# Patient Record
Sex: Male | Born: 1950 | Race: White | Hispanic: No | Marital: Single | Smoking: Current some day smoker
Health system: Southern US, Community
[De-identification: ages and names within clinical notes are randomized; demographics above are authoritative.]

## PROBLEM LIST (undated history)

## (undated) DIAGNOSIS — M1990 Unspecified osteoarthritis, unspecified site: Secondary | ICD-10-CM

## (undated) DIAGNOSIS — J302 Other seasonal allergic rhinitis: Secondary | ICD-10-CM

## (undated) DIAGNOSIS — G43909 Migraine, unspecified, not intractable, without status migrainosus: Secondary | ICD-10-CM

## (undated) DIAGNOSIS — B019 Varicella without complication: Secondary | ICD-10-CM

## (undated) HISTORY — DX: Unspecified osteoarthritis, unspecified site: M19.90

## (undated) HISTORY — DX: Migraine, unspecified, not intractable, without status migrainosus: G43.909

## (undated) HISTORY — DX: Other seasonal allergic rhinitis: J30.2

## (undated) HISTORY — DX: Varicella without complication: B01.9

---

## 1999-01-14 ENCOUNTER — Emergency Department (HOSPITAL_COMMUNITY): Admission: EM | Admit: 1999-01-14 | Discharge: 1999-01-14 | Payer: Self-pay | Admitting: Emergency Medicine

## 1999-01-21 ENCOUNTER — Emergency Department (HOSPITAL_COMMUNITY): Admission: EM | Admit: 1999-01-21 | Discharge: 1999-01-21 | Payer: Self-pay | Admitting: Emergency Medicine

## 2005-07-30 ENCOUNTER — Emergency Department (HOSPITAL_COMMUNITY): Admission: EM | Admit: 2005-07-30 | Discharge: 2005-07-31 | Payer: Self-pay | Admitting: Emergency Medicine

## 2007-02-26 ENCOUNTER — Emergency Department (HOSPITAL_COMMUNITY): Admission: EM | Admit: 2007-02-26 | Discharge: 2007-02-26 | Payer: Self-pay | Admitting: Internal Medicine

## 2017-02-03 ENCOUNTER — Encounter: Payer: Self-pay | Admitting: Adult Health

## 2017-02-03 ENCOUNTER — Encounter: Payer: Self-pay | Admitting: Gastroenterology

## 2017-02-03 ENCOUNTER — Ambulatory Visit (INDEPENDENT_AMBULATORY_CARE_PROVIDER_SITE_OTHER): Payer: Medicare Other | Admitting: Adult Health

## 2017-02-03 VITALS — BP 112/66 | Temp 97.5°F | Ht 70.0 in | Wt 167.0 lb

## 2017-02-03 DIAGNOSIS — Z1211 Encounter for screening for malignant neoplasm of colon: Secondary | ICD-10-CM | POA: Diagnosis not present

## 2017-02-03 DIAGNOSIS — Z7689 Persons encountering health services in other specified circumstances: Secondary | ICD-10-CM | POA: Diagnosis not present

## 2017-02-03 DIAGNOSIS — B351 Tinea unguium: Secondary | ICD-10-CM

## 2017-02-03 DIAGNOSIS — Z23 Encounter for immunization: Secondary | ICD-10-CM

## 2017-02-03 MED ORDER — FLUTICASONE PROPIONATE 50 MCG/ACT NA SUSP: 2.0000 | Freq: Every day | NASAL | 6 refills | Status: DC

## 2017-02-03 MED ORDER — OMEPRAZOLE 20 MG PO CPDR
20.0000 mg | DELAYED_RELEASE_CAPSULE | Freq: Every day | ORAL | 3 refills | Status: DC
Start: 2017-02-03 — End: 2017-07-04

## 2017-02-03 NOTE — Patient Instructions (Signed)
It was great meeting you today   Someone will call you to schedule your colonoscopy and appointment with a foot doctor   Please follow up with me for your physical   I have sent in two prescriptions   1. Flonase- to help with the sinuses  2. Prilosec - to help with acid indigestion

## 2017-02-03 NOTE — Progress Notes (Signed)
Patient presents to clinic today to establish care. She is a pleasant 66 year old male who  has a past medical history of Arthritis; Migraines; and Seasonal allergies.  Has not been seen for greater than 10 years    Acute Concerns: Establish Care   Chronic Issues: Seasonal Allergies - Does not take anything on a regular basis  GERD -  Does not take any medication on a regular basis. He has been having worsening symptoms of heart burn, especially apparent after eating   Health Maintenance: Dental -- Does not do routine visits Vision -- Doe snot do routine visits  Immunizations -- UTD  Colonoscopy -- Never had  Diet: Does not follow a specific diet  Exercise: Does not exercise on a regular basis    Past Medical History:  Diagnosis Date  . Arthritis   . Migraines   . Seasonal allergies     No past surgical history on file.  No current outpatient prescriptions on file prior to visit.   No current facility-administered medications on file prior to visit.     No Known Allergies  Family History  Problem Relation Age of Onset  . Arthritis Mother   . Hearing loss Mother   . Stroke Mother   . Diabetes Father   . Hearing loss Father   . Heart attack Sister   . Hyperlipidemia Sister   . Hypertension Sister     Social History   Social History  . Marital status: Single    Spouse name: N/A  . Number of children: N/A  . Years of education: N/A   Occupational History  . Not on file.   Social History Main Topics  . Smoking status: Never Smoker  . Smokeless tobacco: Never Used  . Alcohol use 2.4 oz/week    4 Cans of beer per week  . Drug use: No  . Sexual activity: Not on file   Other Topics Concern  . Not on file   Social History Narrative  . No narrative on file    Review of Systems  Constitutional: Negative.   HENT: Positive for congestion.   Eyes: Negative.   Respiratory: Negative.   Cardiovascular: Negative.   Gastrointestinal: Positive for  heartburn. Negative for abdominal pain, diarrhea, nausea and vomiting.  Genitourinary: Negative.   Musculoskeletal: Negative.   Skin: Negative.   Neurological: Negative.   Endo/Heme/Allergies: Negative.   Psychiatric/Behavioral: Negative.   All other systems reviewed and are negative.   BP 112/66 (BP Location: Left Arm)   Temp (!) 97.5 F (36.4 C) (Oral)   Ht 5\' 10"  (1.778 m)   Wt 167 lb (75.8 kg)   BMI 23.96 kg/m   Physical Exam  Constitutional: He is oriented to person, place, and time and well-developed, well-nourished, and in no distress. No distress.  HENT:  Head: Normocephalic and atraumatic.  Right Ear: Hearing, tympanic membrane, external ear and ear canal normal.  Left Ear: Hearing, tympanic membrane, external ear and ear canal normal.  Nose: Nose normal. No mucosal edema or rhinorrhea. Right sinus exhibits no maxillary sinus tenderness and no frontal sinus tenderness. Left sinus exhibits no maxillary sinus tenderness and no frontal sinus tenderness.  Mouth/Throat: Oropharynx is clear and moist and mucous membranes are normal. Abnormal dentition. Dental caries present.  Neck: Trachea normal and normal range of motion. Neck supple. Carotid bruit is not present. No thyroid mass and no thyromegaly present.  Cardiovascular: Normal rate, regular rhythm, normal heart sounds and intact distal  pulses.  Exam reveals no gallop and no friction rub.   No murmur heard. Pulmonary/Chest: Effort normal and breath sounds normal. No respiratory distress. He has no wheezes. He has no rales. He exhibits no tenderness.  Abdominal: Soft. Bowel sounds are normal. He exhibits no distension and no mass. There is no tenderness. There is no rebound and no guarding.  Lymphadenopathy:    He has no cervical adenopathy.  Neurological: He is alert and oriented to person, place, and time. He displays normal reflexes. No cranial nerve deficit. He exhibits normal muscle tone. Gait normal. Coordination normal.  GCS score is 15.  Skin: He is not diaphoretic.  Nursing note and vitals reviewed.  Assessment/Plan:  1. Encounter to establish care - Follow up for CPE  - Follow up sooner if needed  2. Colon cancer screening - Ambulatory referral to Gastroenterology  3. Toenail fungus - Ambulatory referral to Podiatry  4. Need for prophylactic vaccination and inoculation against influenza - Flu Vaccine QUAD 6+ mos PF IM (Fluarix Quad PF)  [name], NP

## 2017-02-17 ENCOUNTER — Ambulatory Visit: Payer: Medicare Other | Admitting: Podiatry

## 2017-03-03 ENCOUNTER — Ambulatory Visit (INDEPENDENT_AMBULATORY_CARE_PROVIDER_SITE_OTHER): Payer: Medicare Other | Admitting: Podiatry

## 2017-03-03 ENCOUNTER — Encounter: Payer: Self-pay | Admitting: Podiatry

## 2017-03-03 ENCOUNTER — Encounter: Payer: Medicare Other | Admitting: Adult Health

## 2017-03-03 VITALS — BP 135/82 | HR 60 | Resp 16

## 2017-03-03 DIAGNOSIS — M79675 Pain in left toe(s): Secondary | ICD-10-CM | POA: Diagnosis not present

## 2017-03-03 DIAGNOSIS — M79674 Pain in right toe(s): Secondary | ICD-10-CM

## 2017-03-03 DIAGNOSIS — B351 Tinea unguium: Secondary | ICD-10-CM

## 2017-03-03 NOTE — Progress Notes (Signed)
   Subjective:    Patient ID: Frank Rogers, male    DOB: 1951-03-29, 66 y.o.   MRN: [id number]  HPI 65 year old male presents the also concerns of thick, painful, elongated toenails that he cannot trim himself. He states the right big toenails the worse but they're all causes discomfort and thick. Denies any redness or drainage coming from toe nail sites. He has no other concerns today.   Review of Systems  Musculoskeletal: Positive for arthralgias and gait problem.  Psychiatric/Behavioral: Positive for confusion.  All other systems reviewed and are negative.  Past Medical History:  Diagnosis Date  . Arthritis   . Chicken pox   . Migraines   . Seasonal allergies     No past surgical history on file.   Current Outpatient Prescriptions:  .  fluticasone (FLONASE) 50 MCG/ACT nasal spray, Place 2 sprays into both nostrils daily., Disp: 16 g, Rfl: 6 .  omeprazole (PRILOSEC) 20 MG capsule, Take 1 capsule (20 mg total) by mouth daily., Disp: 30 capsule, Rfl: 3  No Known Allergies  Social History   Social History  . Marital status: Single    Spouse name: N/A  . Number of children: N/A  . Years of education: N/A   Occupational History  . Not on file.   Social History Main Topics  . Smoking status: Never Smoker  . Smokeless tobacco: Never Used  . Alcohol use 2.4 oz/week    4 Cans of beer per week  . Drug use: No  . Sexual activity: Not on file   Other Topics Concern  . Not on file   Social History Narrative    He is retired    Not married            Objective:   Physical Exam  General: NAD  Dermatological: Nails are hypertrophic, dystrophic, brittle, discolored, elongated 10  with the right hallux nail the worst. No surrounding redness or drainage. Tenderness nails 1-5 bilaterally. No open lesions or pre-ulcerative lesions are identified today.  Vascular: Dorsalis Pedis artery and Posterior Tibial artery pedal pulses are 2/4 bilateral with immedate  capillary fill time. Pedal hair growth present.There is no pain with calf compression, swelling, warmth, erythema.   Neruologic: Grossly intact via light touch bilateral. Protective threshold with Semmes Wienstein monofilament intact to all pedal sites bilateral.   Musculoskeletal: No gross boney pedal deformities bilateral. No pain, crepitus, or limitation noted with foot and ankle range of motion bilateral. Muscular strength 5/5 in all groups tested bilateral.      Assessment & Plan:  66 year old male with symptomatic onychomycosis/onychodystrophy -Treatment options discussed including all alternatives, risks, and complications -Etiology of symptoms were discussed -Nails debrided 10 without complications or bleeding. -Daily foot inspection -Follow-up in 3 months or sooner if any problems arise. In the meantime, encouraged to call the office with any questions, concerns, change in symptoms.   Celesta Gentile, DPM

## 2017-03-03 NOTE — Progress Notes (Deleted)
Subjective:    Patient ID: Frank Rogers, male    DOB: Sep 21, 1950, 66 y.o.   MRN: [id number]  HPI  Patient presents for yearly follow up exam. He is a pleasant 66 year old male who  has a past medical history of Arthritis; Chicken pox; Migraines; and Seasonal allergies.  All immunizations and health maintenance protocols were reviewed with the patient and needed orders were placed.  Appropriate screening laboratory values were ordered for the patient including screening of hyperlipidemia, renal function and hepatic function. If indicated by BPH, a PSA was ordered.  Medication reconciliation,  past medical history, social history, problem list and allergies were reviewed in detail with the patient  Goals were established with regard to weight loss, exercise, and  diet in compliance with medications. He does not exercise on a regular basis nor does he eat a heart healthy diet.   He has an appointment for a colonoscopy. He does not participate in routine dental or vision screens    Review of Systems  Constitutional: Negative.   HENT: Negative.   Eyes: Negative.   Respiratory: Negative.   Cardiovascular: Negative.   Gastrointestinal: Negative.   Endocrine: Negative.   Genitourinary: Negative.   Musculoskeletal: Negative.   Skin: Negative.   Allergic/Immunologic: Negative.   Neurological: Negative.   Hematological: Negative.   Psychiatric/Behavioral: Negative.   All other systems reviewed and are negative.  Past Medical History:  Diagnosis Date  . Arthritis   . Chicken pox   . Migraines   . Seasonal allergies     Social History   Social History  . Marital status: Single    Spouse name: N/A  . Number of children: N/A  . Years of education: N/A   Occupational History  . Not on file.   Social History Main Topics  . Smoking status: Never Smoker  . Smokeless tobacco: Never Used  . Alcohol use 2.4 oz/week    4 Cans of beer per week  . Drug use: No  . Sexual  activity: Not on file   Other Topics Concern  . Not on file   Social History Narrative    He is retired    Not married        No past surgical history on file.  Family History  Problem Relation Age of Onset  . Arthritis Mother   . Hearing loss Mother   . Stroke Mother   . Diabetes Father   . Hearing loss Father   . Heart attack Sister   . Hyperlipidemia Sister   . Hypertension Sister     No Known Allergies  Current Outpatient Prescriptions on File Prior to Visit  Medication Sig Dispense Refill  . fluticasone (FLONASE) 50 MCG/ACT nasal spray Place 2 sprays into both nostrils daily. 16 g 6  . omeprazole (PRILOSEC) 20 MG capsule Take 1 capsule (20 mg total) by mouth daily. 30 capsule 3   No current facility-administered medications on file prior to visit.     There were no vitals taken for this visit.      Objective:   Physical Exam  Constitutional: He is oriented to person, place, and time. He appears well-developed and well-nourished. No distress.  HENT:  Head: Normocephalic and atraumatic.  Right Ear: External ear normal.  Left Ear: External ear normal.  Nose: Nose normal.  Mouth/Throat: Oropharynx is clear and moist. Dental caries present. No oropharyngeal exudate.  Eyes: Pupils are equal, round, and reactive to light. Conjunctivae are normal.  Right eye exhibits no discharge. Left eye exhibits no discharge.  Neck: Normal range of motion. Neck supple. No JVD present. No tracheal deviation present. No thyromegaly present.  Cardiovascular: Normal rate, regular rhythm, normal heart sounds and intact distal pulses.  Exam reveals no gallop and no friction rub.   No murmur heard. Pulmonary/Chest: Effort normal and breath sounds normal. No stridor. No respiratory distress. He has no wheezes. He has no rales. He exhibits no tenderness.  Abdominal: Soft. Bowel sounds are normal. He exhibits no distension and no mass. There is no tenderness. There is no rebound and no  guarding.  Musculoskeletal: Normal range of motion. He exhibits deformity. He exhibits no edema or tenderness.  Lymphadenopathy:    He has no cervical adenopathy.  Neurological: He is alert and oriented to person, place, and time. He has normal reflexes. He displays normal reflexes. No cranial nerve deficit. He exhibits normal muscle tone. Coordination normal.  Skin: Skin is warm and dry. No rash noted. He is not diaphoretic. No erythema. No pallor.  Psychiatric: He has a normal mood and affect. His behavior is normal. Judgment and thought content normal.  Nursing note and vitals reviewed.     Assessment & Plan:

## 2017-04-05 ENCOUNTER — Encounter: Payer: Self-pay | Admitting: Adult Health

## 2017-04-07 ENCOUNTER — Encounter: Payer: Medicare Other | Admitting: Gastroenterology

## 2017-04-14 ENCOUNTER — Ambulatory Visit (INDEPENDENT_AMBULATORY_CARE_PROVIDER_SITE_OTHER): Payer: Medicare Other | Admitting: Adult Health

## 2017-04-14 ENCOUNTER — Encounter: Payer: Self-pay | Admitting: Adult Health

## 2017-04-14 VITALS — BP 112/62 | Temp 97.9°F | Ht 70.0 in | Wt 170.0 lb

## 2017-04-14 DIAGNOSIS — Z1159 Encounter for screening for other viral diseases: Secondary | ICD-10-CM | POA: Diagnosis not present

## 2017-04-14 DIAGNOSIS — N400 Enlarged prostate without lower urinary tract symptoms: Secondary | ICD-10-CM | POA: Diagnosis not present

## 2017-04-14 DIAGNOSIS — K219 Gastro-esophageal reflux disease without esophagitis: Secondary | ICD-10-CM | POA: Diagnosis not present

## 2017-04-14 DIAGNOSIS — Z83438 Family history of other disorder of lipoprotein metabolism and other lipidemia: Secondary | ICD-10-CM | POA: Diagnosis not present

## 2017-04-14 LAB — HEPATIC FUNCTION PANEL
ALK PHOS: 79 U/L (ref 39–117)
ALT: 22 U/L (ref 0–53)
AST: 19 U/L (ref 0–37)
Albumin: 4.3 g/dL (ref 3.5–5.2)
BILIRUBIN TOTAL: 0.7 mg/dL (ref 0.2–1.2)
Bilirubin, Direct: 0.1 mg/dL (ref 0.0–0.3)
Total Protein: 7 g/dL (ref 6.0–8.3)

## 2017-04-14 LAB — LIPID PANEL
CHOL/HDL RATIO: 4
CHOLESTEROL: 153 mg/dL (ref 0–200)
HDL: 34.4 mg/dL — ABNORMAL LOW (ref >39.00)
LDL CALC: 101 mg/dL — AB (ref 0–99)
NONHDL: 118.83
Triglycerides: 88 mg/dL (ref 0.0–149.0)
VLDL: 17.6 mg/dL (ref 0.0–40.0)

## 2017-04-14 LAB — CBC WITH DIFFERENTIAL/PLATELET
BASOS ABS: 0 10*3/uL (ref 0.0–0.1)
Basophils Relative: 0.7 % (ref 0.0–3.0)
EOS PCT: 4.2 % (ref 0.0–5.0)
Eosinophils Absolute: 0.2 10*3/uL (ref 0.0–0.7)
HCT: 42 % (ref 39.0–52.0)
HEMOGLOBIN: 14.3 g/dL (ref 13.0–17.0)
Lymphocytes Relative: 34.2 % (ref 12.0–46.0)
Lymphs Abs: 1.3 10*3/uL (ref 0.7–4.0)
MCHC: 34.2 g/dL (ref 30.0–36.0)
MCV: 86.3 fl (ref 78.0–100.0)
MONO ABS: 0.3 10*3/uL (ref 0.1–1.0)
MONOS PCT: 8.3 % (ref 3.0–12.0)
Neutro Abs: 2 10*3/uL (ref 1.4–7.7)
Neutrophils Relative %: 52.6 % (ref 43.0–77.0)
Platelets: 203 10*3/uL (ref 150.0–400.0)
RBC: 4.86 Mil/uL (ref 4.22–5.81)
RDW: 13.4 % (ref 11.5–15.5)
WBC: 3.8 10*3/uL — AB (ref 4.0–10.5)

## 2017-04-14 LAB — BASIC METABOLIC PANEL
BUN: 20 mg/dL (ref 6–23)
CO2: 29 mEq/L (ref 19–32)
Calcium: 9.1 mg/dL (ref 8.4–10.5)
Chloride: 102 mEq/L (ref 96–112)
Creatinine, Ser: 0.93 mg/dL (ref 0.40–1.50)
GFR: 86.87 mL/min (ref >60.00)
Glucose, Bld: 98 mg/dL (ref 70–99)
POTASSIUM: 4.1 meq/L (ref 3.5–5.1)
SODIUM: 139 meq/L (ref 135–145)

## 2017-04-14 LAB — PSA: PSA: 6.5 ng/mL — AB (ref 0.10–4.00)

## 2017-04-14 NOTE — Progress Notes (Signed)
Subjective:    Patient ID: Frank Rogers, male    DOB: 15-Aug-1950, 66 y.o.   MRN: [id number]  HPI  Patient presents for yearly preventative medicine examination. He is a pleasant 66 year old male who  has a past medical history of Arthritis, Chicken pox, Migraines, and Seasonal allergies.   His last annual exam was 10 years ago  He takes Flonase for seasonal allergies   He takes Prilosec for GERD - well controlled.   All immunizations and health maintenance protocols were reviewed with the patient and needed orders were placed. He is up to date   Appropriate screening laboratory values were ordered for the patient including screening of hyperlipidemia, renal function and hepatic function. If indicated by BPH, a PSA was ordered.  Medication reconciliation,  past medical history, social history, problem list and allergies were reviewed in detail with the patient  Goals were established with regard to weight loss, exercise, and  diet in compliance with medications  End of life planning was discussed.  He does not participate in routine dental or vision care. An order for a colonoscopy was entered during his last visit but he cancelled the appointment; he plans on rescheduling   Review of Systems  Constitutional: Negative.   HENT: Negative.   Eyes: Negative.   Respiratory: Negative.   Cardiovascular: Negative.   Gastrointestinal: Negative.   Endocrine: Negative.   Genitourinary: Negative.   Musculoskeletal: Negative.   Neurological: Negative.   Hematological: Negative.   Psychiatric/Behavioral: Negative.   All other systems reviewed and are negative.  Past Medical History:  Diagnosis Date  . Arthritis   . Chicken pox   . Migraines   . Seasonal allergies     Social History   Socioeconomic History  . Marital status: Single    Spouse name: Not on file  . Number of children: Not on file  . Years of education: Not on file  . Highest education level: Not on file    Social Needs  . Financial resource strain: Not on file  . Food insecurity - worry: Not on file  . Food insecurity - inability: Not on file  . Transportation needs - medical: Not on file  . Transportation needs - non-medical: Not on file  Occupational History  . Not on file  Tobacco Use  . Smoking status: Never Smoker  . Smokeless tobacco: Never Used  Substance and Sexual Activity  . Alcohol use: Yes    Alcohol/week: 2.4 oz    Types: 4 Cans of beer per week  . Drug use: No  . Sexual activity: Not on file  Other Topics Concern  . Not on file  Social History Narrative    He is retired    Not married     History reviewed. No pertinent surgical history.  Family History  Problem Relation Age of Onset  . Arthritis Mother   . Hearing loss Mother   . Stroke Mother   . Diabetes Father   . Hearing loss Father   . Heart attack Sister   . Hyperlipidemia Sister   . Hypertension Sister     No Known Allergies  Current Outpatient Medications on File Prior to Visit  Medication Sig Dispense Refill  . fluticasone (FLONASE) 50 MCG/ACT nasal spray Place 2 sprays into both nostrils daily. 16 g 6  . omeprazole (PRILOSEC) 20 MG capsule Take 1 capsule (20 mg total) by mouth daily. 30 capsule 3   No current facility-administered medications on  file prior to visit.     BP 112/62 (BP Location: Left Arm)   Temp 97.9 F (36.6 C) (Oral)   Ht 5\' 10"  (1.778 m)   Wt 170 lb (77.1 kg)   BMI 24.39 kg/m       Objective:   Physical Exam  Constitutional: He is oriented to person, place, and time. He appears well-developed and well-nourished. No distress.  HENT:  Head: Normocephalic and atraumatic.  Right Ear: External ear normal.  Left Ear: External ear normal.  Nose: Nose normal.  Mouth/Throat: Oropharynx is clear and moist and mucous membranes are normal. Abnormal dentition. No oropharyngeal exudate.  Eyes: Conjunctivae and EOM are normal. Pupils are equal, round, and reactive to light.  Right eye exhibits no discharge. Left eye exhibits no discharge. No scleral icterus.  Neck: Normal range of motion. Neck supple. No JVD present. Carotid bruit is not present. No tracheal deviation present. No thyroid mass and no thyromegaly present.  Cardiovascular: Normal rate, regular rhythm, normal heart sounds and intact distal pulses. Exam reveals no gallop and no friction rub.  No murmur heard. Pulmonary/Chest: Effort normal and breath sounds normal. No stridor. No respiratory distress. He has no wheezes. He has no rales. He exhibits no tenderness.  Abdominal: Soft. Bowel sounds are normal. He exhibits no distension and no mass. There is no tenderness. There is no rebound and no guarding.  Genitourinary: Rectum normal. Prostate is enlarged. Prostate is not tender.  Musculoskeletal: Normal range of motion. He exhibits no edema or deformity.  Lymphadenopathy:    He has no cervical adenopathy.  Neurological: He is alert and oriented to person, place, and time. He has normal reflexes. No cranial nerve deficit. Coordination normal.  Skin: Skin is warm and dry. No rash noted. He is not diaphoretic. No erythema. No pallor.  Psychiatric: He has a normal mood and affect. His behavior is normal. Judgment and thought content normal.  Nursing note and vitals reviewed.     Assessment & Plan:  1. Family history of hyperlipidemia - Consider statin if needed - Basic metabolic panel - CBC with Differential/Platelet - Hepatic function panel - Lipid panel - PSA  2. Gastroesophageal reflux disease without esophagitis - Controlled with Prilosec  - Basic metabolic panel - CBC with Differential/Platelet - Hepatic function panel - Lipid panel - PSA  3. Benign prostatic hyperplasia without lower urinary tract symptoms  - Basic metabolic panel - CBC with Differential/Platelet - Hepatic function panel - Lipid panel - PSA  4. Need for hepatitis C screening test  - Hep C Antibody   [name], AGNP

## 2017-04-14 NOTE — Patient Instructions (Signed)
It was great seeing you today   I am going to follow up with you about your labs   Please call [phone]. To reschedule your colonoscopy

## 2017-04-15 LAB — HEPATITIS C ANTIBODY
Hepatitis C Ab: NONREACTIVE
SIGNAL TO CUT-OFF: 0.06 (ref <1.00)

## 2017-06-07 ENCOUNTER — Ambulatory Visit (INDEPENDENT_AMBULATORY_CARE_PROVIDER_SITE_OTHER): Payer: Medicare Other | Admitting: Podiatry

## 2017-06-07 ENCOUNTER — Encounter: Payer: Self-pay | Admitting: Podiatry

## 2017-06-07 DIAGNOSIS — B351 Tinea unguium: Secondary | ICD-10-CM

## 2017-06-07 DIAGNOSIS — M79675 Pain in left toe(s): Secondary | ICD-10-CM

## 2017-06-07 DIAGNOSIS — M79674 Pain in right toe(s): Secondary | ICD-10-CM | POA: Diagnosis not present

## 2017-06-07 NOTE — Progress Notes (Signed)
Complaint:  Visit Type: Patient returns to my office for continued preventative foot care services. Complaint: Patient states" my nails have grown long and thick and become painful to walk and wear shoes" . The patient presents for preventative foot care services. No changes to ROS  Podiatric Exam: Vascular: dorsalis pedis and posterior tibial pulses are palpable bilateral. Capillary return is immediate. Temperature gradient is WNL. Skin turgor WNL  Sensorium: Normal Semmes Weinstein monofilament test. Normal tactile sensation bilaterally. Nail Exam: Pt has thick disfigured discolored nails with subungual debris noted bilateral entire nail hallux through fifth toenails Ulcer Exam: There is no evidence of ulcer or pre-ulcerative changes or infection. Orthopedic Exam: Muscle tone and strength are WNL. No limitations in general ROM. No crepitus or effusions noted. Foot type and digits show no abnormalities. Bony prominences are unremarkable. Skin: No Porokeratosis. No infection or ulcers  Diagnosis:  Onychomycosis, , Pain in right toe, pain in left toes  Treatment & Plan Procedures and Treatment: Consent by patient was obtained for treatment procedures.   Debridement of mycotic and hypertrophic toenails, 1 through 5 bilateral and clearing of subungual debris. No ulceration, no infection noted.  Return Visit-Office Procedure: Patient instructed to return to the office for a follow up visit 3 months for continued evaluation and treatment.    Tamekia Rotter DPM 

## 2017-06-23 ENCOUNTER — Encounter: Payer: Self-pay | Admitting: Adult Health

## 2017-06-23 ENCOUNTER — Ambulatory Visit (INDEPENDENT_AMBULATORY_CARE_PROVIDER_SITE_OTHER): Payer: Medicare Other | Admitting: Adult Health

## 2017-06-23 VITALS — BP 110/70 | Temp 97.8°F | Wt 170.0 lb

## 2017-06-23 DIAGNOSIS — R972 Elevated prostate specific antigen [PSA]: Secondary | ICD-10-CM

## 2017-06-23 LAB — PSA: PSA: 6.52 ng/mL — ABNORMAL HIGH (ref 0.10–4.00)

## 2017-06-23 NOTE — Progress Notes (Signed)
Subjective:    Patient ID: Frank Rogers, male    DOB: August 13, 1950, 67 y.o.   MRN: [id number]  HPI 67 year old male who  has a past medical history of Arthritis, Chicken pox, Migraines, and Seasonal allergies.  He presents to the office today for follow up regarding elevated PSA. His PSA during his last visit was   Lab Results  Component Value Date   PSA 6.50 (H) 04/14/2017    Prostate was enlarged but had no nodules. He denies any complaints   Review of Systems See HPI   Past Medical History:  Diagnosis Date  . Arthritis   . Chicken pox   . Migraines   . Seasonal allergies     Social History   Socioeconomic History  . Marital status: Single    Spouse name: Not on file  . Number of children: Not on file  . Years of education: Not on file  . Highest education level: Not on file  Social Needs  . Financial resource strain: Not on file  . Food insecurity - worry: Not on file  . Food insecurity - inability: Not on file  . Transportation needs - medical: Not on file  . Transportation needs - non-medical: Not on file  Occupational History  . Not on file  Tobacco Use  . Smoking status: Never Smoker  . Smokeless tobacco: Never Used  Substance and Sexual Activity  . Alcohol use: Yes    Alcohol/week: 2.4 oz    Types: 4 Cans of beer per week  . Drug use: No  . Sexual activity: Not on file  Other Topics Concern  . Not on file  Social History Narrative    He is retired    Not married     History reviewed. No pertinent surgical history.  Family History  Problem Relation Age of Onset  . Arthritis Mother   . Hearing loss Mother   . Stroke Mother   . Diabetes Father   . Hearing loss Father   . Heart attack Sister   . Hyperlipidemia Sister   . Hypertension Sister     No Known Allergies  Current Outpatient Medications on File Prior to Visit  Medication Sig Dispense Refill  . fluticasone (FLONASE) 50 MCG/ACT nasal spray Place 2 sprays into both nostrils  daily. 16 g 6  . omeprazole (PRILOSEC) 20 MG capsule Take 1 capsule (20 mg total) by mouth daily. 30 capsule 3   No current facility-administered medications on file prior to visit.     BP 110/70 (BP Location: Left Arm)   Temp 97.8 F (36.6 C) (Oral)   Wt 170 lb (77.1 kg)   BMI 24.39 kg/m       Objective:   Physical Exam  Constitutional: He is oriented to person, place, and time. He appears well-developed and well-nourished.  Cardiovascular: Normal rate, regular rhythm, normal heart sounds and intact distal pulses. Exam reveals no gallop and no friction rub.  No murmur heard. Pulmonary/Chest: Effort normal and breath sounds normal. No respiratory distress. He has no wheezes. He has no rales. He exhibits no tenderness.  Genitourinary: Rectal exam shows no tenderness and anal tone normal. Prostate is enlarged. Prostate is not tender.  Genitourinary Comments: No nodules felt. No assemetry noted   Neurological: He is alert and oriented to person, place, and time.  Skin: Skin is warm and dry. No rash noted. He is not diaphoretic. No erythema. No pallor.  Psychiatric: He has a normal  mood and affect. His behavior is normal. Thought content normal.  Nursing note and vitals reviewed.     Assessment & Plan:  1. Elevated PSA - PSA - consider referral to urology   [name], NP

## 2017-07-04 ENCOUNTER — Other Ambulatory Visit: Payer: Self-pay | Admitting: Family Medicine

## 2017-07-04 ENCOUNTER — Other Ambulatory Visit: Payer: Self-pay | Admitting: Adult Health

## 2017-07-04 DIAGNOSIS — R972 Elevated prostate specific antigen [PSA]: Secondary | ICD-10-CM

## 2017-07-06 NOTE — Telephone Encounter (Signed)
SENT TO THE PHARMACY BY E-SCRIBE. 

## 2017-08-14 DIAGNOSIS — R972 Elevated prostate specific antigen [PSA]: Secondary | ICD-10-CM | POA: Diagnosis not present

## 2017-08-14 DIAGNOSIS — N401 Enlarged prostate with lower urinary tract symptoms: Secondary | ICD-10-CM | POA: Diagnosis not present

## 2017-08-14 DIAGNOSIS — R350 Frequency of micturition: Secondary | ICD-10-CM | POA: Diagnosis not present

## 2017-08-14 DIAGNOSIS — N3000 Acute cystitis without hematuria: Secondary | ICD-10-CM | POA: Diagnosis not present

## 2017-09-06 ENCOUNTER — Ambulatory Visit: Payer: Medicare HMO | Admitting: Podiatry

## 2017-09-06 ENCOUNTER — Encounter: Payer: Self-pay | Admitting: Podiatry

## 2017-09-06 DIAGNOSIS — M79674 Pain in right toe(s): Secondary | ICD-10-CM | POA: Diagnosis not present

## 2017-09-06 DIAGNOSIS — B351 Tinea unguium: Secondary | ICD-10-CM | POA: Diagnosis not present

## 2017-09-06 DIAGNOSIS — M79675 Pain in left toe(s): Secondary | ICD-10-CM | POA: Diagnosis not present

## 2017-09-06 NOTE — Progress Notes (Signed)
Complaint:  Visit Type: Patient returns to my office for continued preventative foot care services. Complaint: Patient states" my nails have grown long and thick and become painful to walk and wear shoes" . The patient presents for preventative foot care services. No changes to ROS  Podiatric Exam: Vascular: dorsalis pedis and posterior tibial pulses are palpable bilateral. Capillary return is immediate. Temperature gradient is WNL. Skin turgor WNL  Sensorium: Normal Semmes Weinstein monofilament test. Normal tactile sensation bilaterally. Nail Exam: Pt has thick disfigured discolored nails with subungual debris noted bilateral entire nail hallux through fifth toenails Ulcer Exam: There is no evidence of ulcer or pre-ulcerative changes or infection. Orthopedic Exam: Muscle tone and strength are WNL. No limitations in general ROM. No crepitus or effusions noted. Foot type and digits show no abnormalities. Bony prominences are unremarkable. Skin: No Porokeratosis. No infection or ulcers  Diagnosis:  Onychomycosis, , Pain in right toe, pain in left toes  Treatment & Plan Procedures and Treatment: Consent by patient was obtained for treatment procedures.   Debridement of mycotic and hypertrophic toenails, 1 through 5 bilateral and clearing of subungual debris. No ulceration, no infection noted.  Return Visit-Office Procedure: Patient instructed to return to the office for a follow up visit 3 months for continued evaluation and treatment.    Arrow Emmerich DPM 

## 2017-09-18 ENCOUNTER — Other Ambulatory Visit (HOSPITAL_COMMUNITY)
Admission: RE | Admit: 2017-09-18 | Discharge: 2017-09-18 | Disposition: A | Discharge status: Home / Self Care | Payer: Medicare HMO | Source: Other Acute Inpatient Hospital | Attending: Urology | Admitting: Urology

## 2017-09-18 DIAGNOSIS — N9989 Other postprocedural complications and disorders of genitourinary system: Secondary | ICD-10-CM | POA: Insufficient documentation

## 2017-09-18 DIAGNOSIS — R972 Elevated prostate specific antigen [PSA]: Secondary | ICD-10-CM | POA: Diagnosis not present

## 2017-09-18 LAB — PSA: PSA: 5.34

## 2017-09-25 DIAGNOSIS — R972 Elevated prostate specific antigen [PSA]: Secondary | ICD-10-CM | POA: Diagnosis not present

## 2017-09-25 DIAGNOSIS — N3000 Acute cystitis without hematuria: Secondary | ICD-10-CM | POA: Diagnosis not present

## 2017-12-08 ENCOUNTER — Encounter: Payer: Self-pay | Admitting: Podiatry

## 2017-12-08 ENCOUNTER — Ambulatory Visit: Payer: Medicare HMO | Admitting: Podiatry

## 2017-12-08 DIAGNOSIS — M79675 Pain in left toe(s): Secondary | ICD-10-CM | POA: Diagnosis not present

## 2017-12-08 DIAGNOSIS — B351 Tinea unguium: Secondary | ICD-10-CM | POA: Diagnosis not present

## 2017-12-08 DIAGNOSIS — M79674 Pain in right toe(s): Secondary | ICD-10-CM

## 2017-12-08 NOTE — Progress Notes (Signed)
Complaint:  Visit Type: Patient returns to my office for continued preventative foot care services. Complaint: Patient states" my nails have grown long and thick and become painful to walk and wear shoes" . The patient presents for preventative foot care services. No changes to ROS  Podiatric Exam: Vascular: dorsalis pedis and posterior tibial pulses are palpable bilateral. Capillary return is immediate. Temperature gradient is WNL. Skin turgor WNL  Sensorium: Normal Semmes Weinstein monofilament test. Normal tactile sensation bilaterally. Nail Exam: Pt has thick disfigured discolored nails with subungual debris noted bilateral entire nail hallux through fifth toenails Ulcer Exam: There is no evidence of ulcer or pre-ulcerative changes or infection. Orthopedic Exam: Muscle tone and strength are WNL. No limitations in general ROM. No crepitus or effusions noted. Foot type and digits show no abnormalities. Bony prominences are unremarkable. Skin: No Porokeratosis. No infection or ulcers  Diagnosis:  Onychomycosis, , Pain in right toe, pain in left toes  Treatment & Plan Procedures and Treatment: Consent by patient was obtained for treatment procedures.   Debridement of mycotic and hypertrophic toenails, 1 through 5 bilateral and clearing of subungual debris. No ulceration, no infection noted.  Return Visit-Office Procedure: Patient instructed to return to the office for a follow up visit 3 months for continued evaluation and treatment.    Amauris Debois DPM 

## 2018-02-15 ENCOUNTER — Other Ambulatory Visit: Payer: Self-pay | Admitting: Adult Health

## 2018-02-16 NOTE — Telephone Encounter (Signed)
Sent to the pharmacy by e-scribe. 

## 2018-03-13 ENCOUNTER — Ambulatory Visit: Payer: Medicare HMO | Admitting: Podiatry

## 2018-05-13 ENCOUNTER — Other Ambulatory Visit: Payer: Self-pay | Admitting: Adult Health

## 2018-05-15 ENCOUNTER — Encounter: Payer: Self-pay | Admitting: Family Medicine

## 2018-05-15 NOTE — Telephone Encounter (Signed)
Sent to the pharmacy by e-scribe.  Letter sent to the pt.  Now due for cpx and lab work.

## 2018-05-25 ENCOUNTER — Encounter: Payer: Self-pay | Admitting: Podiatry

## 2018-05-25 ENCOUNTER — Ambulatory Visit: Payer: Medicare HMO | Admitting: Podiatry

## 2018-05-25 DIAGNOSIS — M79674 Pain in right toe(s): Secondary | ICD-10-CM

## 2018-05-25 DIAGNOSIS — B351 Tinea unguium: Secondary | ICD-10-CM | POA: Diagnosis not present

## 2018-05-25 DIAGNOSIS — M79675 Pain in left toe(s): Secondary | ICD-10-CM

## 2018-05-25 NOTE — Progress Notes (Addendum)
Complaint:  Visit Type: Patient returns to my office for continued preventative foot care services. Complaint: Patient states" my nails have grown long and thick and become painful to walk and wear shoes"The patient presents for preventative foot care services. No changes to ROS  Podiatric Exam: Vascular: dorsalis pedis and posterior tibial pulses are palpable bilateral. Capillary return is immediate. Temperature gradient is WNL. Skin turgor WNL  Sensorium: Normal Semmes Weinstein monofilament test. Normal tactile sensation bilaterally. Nail Exam: Pt has thick disfigured discolored nails with subungual debris noted bilateral entire nail hallux through fifth toenails Ulcer Exam: There is no evidence of ulcer or pre-ulcerative changes or infection. Orthopedic Exam: Muscle tone and strength are WNL. No limitations in general ROM. No crepitus or effusions noted. Foot type and digits show no abnormalities. HAV  B/L. Skin: No Porokeratosis. No infection or ulcers  Diagnosis:  Onychomycosis, , Pain in right toe, pain in left toes  Treatment & Plan Procedures and Treatment: Consent by patient was obtained for treatment procedures.   Debridement of mycotic and hypertrophic toenails, 1 through 5 bilateral and clearing of subungual debris. No ulceration, no infection noted.  Return Visit-Office Procedure: Patient instructed to return to the office for a follow up visit 3 months for continued evaluation and treatment.    Brittiney Dicostanzo DPM 

## 2018-06-15 ENCOUNTER — Telehealth: Payer: Self-pay

## 2018-06-15 NOTE — Telephone Encounter (Signed)
Copied from [place] [id number]. Topic: General - Other >> Jun 15, 2018  2:00 PM Oneta Rack wrote: Allie Dimmer [phone]  (caretaker) was advised to contact patient PCP office due to patient being exposed to the flu and request tamiflu, please advise when Rx had been sent to pharmacy  Pharmacy:  [id number] - [place], [place] [phone] (Phone) [phone] (Fax)

## 2018-06-15 NOTE — Telephone Encounter (Signed)
Reviewed health hx.  Spoke to [place].  Advised that Frank Rogers does not treat prophylactically for  influenza.  Advised an office visit if the pt shows signs of influenza.  Frank Rogers agreed.  Nothing further needed.

## 2018-08-08 ENCOUNTER — Telehealth: Payer: Self-pay

## 2018-08-08 NOTE — Telephone Encounter (Signed)
Author phoned pt. to assess interest in scheduling virtual awv. No answer, author left detailed VM asking for return call if interested, or if interested in scheduling in-office visit with [facility] for later in the year.

## 2018-08-24 ENCOUNTER — Ambulatory Visit: Payer: Medicare HMO | Admitting: Podiatry

## 2018-09-05 ENCOUNTER — Other Ambulatory Visit: Payer: Self-pay | Admitting: Adult Health

## 2018-10-10 ENCOUNTER — Other Ambulatory Visit: Payer: Self-pay

## 2018-10-10 ENCOUNTER — Ambulatory Visit: Payer: Medicare HMO | Admitting: Podiatry

## 2018-10-10 ENCOUNTER — Encounter: Payer: Self-pay | Admitting: Podiatry

## 2018-10-10 VITALS — Temp 97.3°F

## 2018-10-10 DIAGNOSIS — M79675 Pain in left toe(s): Secondary | ICD-10-CM

## 2018-10-10 DIAGNOSIS — M79674 Pain in right toe(s): Secondary | ICD-10-CM | POA: Diagnosis not present

## 2018-10-10 DIAGNOSIS — B351 Tinea unguium: Secondary | ICD-10-CM | POA: Diagnosis not present

## 2018-10-10 NOTE — Progress Notes (Signed)
Complaint:  Visit Type: Patient returns to my office for continued preventative foot care services. Complaint: Patient states" my nails have grown long and thick and become painful to walk and wear shoes"  The patient presents for preventative foot care services. No changes to ROS  Podiatric Exam: Vascular: dorsalis pedis and posterior tibial pulses are palpable bilateral. Capillary return is immediate. Temperature gradient is WNL. Skin turgor WNL  Sensorium: Normal Semmes Weinstein monofilament test. Normal tactile sensation bilaterally. Nail Exam: Pt has thick disfigured discolored nails with subungual debris noted bilateral entire nail hallux through fifth toenails Ulcer Exam: There is no evidence of ulcer or pre-ulcerative changes or infection. Orthopedic Exam: Muscle tone and strength are WNL. No limitations in general ROM. No crepitus or effusions noted. Foot type and digits show no abnormalities. HAV  B/L. Skin: No Porokeratosis. No infection or ulcers  Diagnosis:  Onychomycosis, , Pain in right toe, pain in left toes  Treatment & Plan Procedures and Treatment: Consent by patient was obtained for treatment procedures.   Debridement of mycotic and hypertrophic toenails, 1 through 5 bilateral and clearing of subungual debris. No ulceration, no infection noted.  Return Visit-Office Procedure: Patient instructed to return to the office for a follow up visit 3 months for continued evaluation and treatment.    Gardiner Barefoot DPM

## 2018-11-23 ENCOUNTER — Encounter: Payer: Medicare HMO | Admitting: Adult Health

## 2018-11-23 DIAGNOSIS — Z0289 Encounter for other administrative examinations: Secondary | ICD-10-CM

## 2018-11-23 NOTE — Progress Notes (Deleted)
Subjective:    Patient ID: Frank Rogers, male    DOB: 1951-05-06, 68 y.o.   MRN: [id number]  HPI Patient presents for yearly preventative medicine examination. He is a pleasant 68 year old male who  has a past medical history of Arthritis, Chicken pox, Migraines, and Seasonal allergies.  Seasonal Allergies - uses flonase   GERD - Takes Prilosec 20 mg daily   Elevated PSA- Was sent to Urology in 06/2017 due to elevated PSA levels.  Lab Results  Component Value Date   PSA 6.52 (H) 06/23/2017   PSA 6.50 (H) 04/14/2017    All immunizations and health maintenance protocols were reviewed with the patient and needed orders were placed. He is due for Prevnar 13   Appropriate screening laboratory values were ordered for the patient including screening of hyperlipidemia, renal function and hepatic function. If indicated by BPH, a PSA was ordered.  Medication reconciliation,  past medical history, social history, problem list and allergies were reviewed in detail with the patient  Goals were established with regard to weight loss, exercise, and  diet in compliance with medications  End of life planning was discussed.   He is due for routine screening colonoscopy - cancelled his last appointment in 2018 and never rescheduled.   Review of Systems  Constitutional: Negative.   HENT: Negative.   Eyes: Negative.   Respiratory: Negative.   Cardiovascular: Negative.   Gastrointestinal: Negative.   Endocrine: Negative.   Genitourinary: Negative.   Musculoskeletal: Negative.   Skin: Negative.   Allergic/Immunologic: Negative.   Neurological: Negative.   Hematological: Negative.   Psychiatric/Behavioral: Negative.   All other systems reviewed and are negative.  Past Medical History:  Diagnosis Date  . Arthritis   . Chicken pox   . Migraines   . Seasonal allergies     Social History   Socioeconomic History  . Marital status: Single    Spouse name: Not on file  . Number  of children: Not on file  . Years of education: Not on file  . Highest education level: Not on file  Occupational History  . Not on file  Social Needs  . Financial resource strain: Not on file  . Food insecurity    Worry: Not on file    Inability: Not on file  . Transportation needs    Medical: Not on file    Non-medical: Not on file  Tobacco Use  . Smoking status: Never Smoker  . Smokeless tobacco: Never Used  Substance and Sexual Activity  . Alcohol use: Yes    Alcohol/week: 4.0 standard drinks    Types: 4 Cans of beer per week  . Drug use: No  . Sexual activity: Not on file  Lifestyle  . Physical activity    Days per week: Not on file    Minutes per session: Not on file  . Stress: Not on file  Relationships  . Social Herbalist on phone: Not on file    Gets together: Not on file    Attends religious service: Not on file    Active member of club or organization: Not on file    Attends meetings of clubs or organizations: Not on file    Relationship status: Not on file  . Intimate partner violence    Fear of current or ex partner: Not on file    Emotionally abused: Not on file    Physically abused: Not on file    Forced  sexual activity: Not on file  Other Topics Concern  . Not on file  Social History Narrative    He is retired    Not married     No past surgical history on file.  Family History  Problem Relation Age of Onset  . Arthritis Mother   . Hearing loss Mother   . Stroke Mother   . Diabetes Father   . Hearing loss Father   . Heart attack Sister   . Hyperlipidemia Sister   . Hypertension Sister     No Known Allergies  Current Outpatient Medications on File Prior to Visit  Medication Sig Dispense Refill  . fluticasone (FLONASE) 50 MCG/ACT nasal spray SPRAY 2 SPRAYS INTO EACH NOSTRIL EVERY DAY 48 g 0  . omeprazole (PRILOSEC) 20 MG capsule TAKE 1 CAPSULE BY MOUTH EVERY DAY 90 capsule 1   No current facility-administered medications on  file prior to visit.     There were no vitals taken for this visit.      Objective:   Physical Exam Vitals signs and nursing note reviewed.  Constitutional:      General: He is not in acute distress.    Appearance: Normal appearance. He is not diaphoretic.  HENT:     Head: Normocephalic and atraumatic.     Right Ear: Tympanic membrane, ear canal and external ear normal. There is no impacted cerumen.     Left Ear: Tympanic membrane, ear canal and external ear normal. There is no impacted cerumen.     Nose: Nose normal. No congestion or rhinorrhea.     Mouth/Throat:     Mouth: Mucous membranes are moist.     Pharynx: Oropharynx is clear. No oropharyngeal exudate or posterior oropharyngeal erythema.  Eyes:     General: No scleral icterus.       Right eye: No discharge.        Left eye: No discharge.     Conjunctiva/sclera: Conjunctivae normal.     Pupils: Pupils are equal, round, and reactive to light.  Neck:     Musculoskeletal: Normal range of motion and neck supple.     Thyroid: No thyromegaly.     Vascular: No JVD.     Trachea: No tracheal deviation.  Cardiovascular:     Rate and Rhythm: Normal rate and regular rhythm.     Pulses: Normal pulses.     Heart sounds: Normal heart sounds. No murmur. No friction rub. No gallop.   Pulmonary:     Effort: Pulmonary effort is normal. No respiratory distress.     Breath sounds: Normal breath sounds. No stridor. No wheezing or rales.  Chest:     Chest wall: No tenderness.  Abdominal:     General: Abdomen is flat. Bowel sounds are normal. There is no distension.     Palpations: Abdomen is soft. There is no mass.     Tenderness: There is no abdominal tenderness. There is no right CVA tenderness, left CVA tenderness, guarding or rebound.     Hernia: No hernia is present.  Musculoskeletal: Normal range of motion.        General: No swelling, tenderness, deformity or signs of injury.     Right lower leg: No edema.     Left lower leg:  No edema.  Lymphadenopathy:     Cervical: No cervical adenopathy.  Skin:    General: Skin is warm and dry.     Coloration: Skin is not jaundiced or pale.  Findings: No bruising, erythema, lesion or rash.  Neurological:     General: No focal deficit present.     Mental Status: He is alert and oriented to person, place, and time.     Cranial Nerves: No cranial nerve deficit.     Sensory: No sensory deficit.     Motor: No weakness or abnormal muscle tone.     Coordination: Coordination normal.     Gait: Gait normal.     Deep Tendon Reflexes: Reflexes are normal and symmetric. Reflexes normal.  Psychiatric:        Mood and Affect: Mood normal.        Behavior: Behavior normal.        Thought Content: Thought content normal.        Judgment: Judgment normal.       Assessment & Plan:

## 2019-01-16 ENCOUNTER — Encounter: Payer: Self-pay | Admitting: Podiatry

## 2019-01-16 ENCOUNTER — Other Ambulatory Visit: Payer: Self-pay

## 2019-01-16 ENCOUNTER — Ambulatory Visit: Payer: Medicare HMO | Admitting: Podiatry

## 2019-01-16 DIAGNOSIS — B351 Tinea unguium: Secondary | ICD-10-CM | POA: Diagnosis not present

## 2019-01-16 DIAGNOSIS — M79674 Pain in right toe(s): Secondary | ICD-10-CM | POA: Diagnosis not present

## 2019-01-16 DIAGNOSIS — M79675 Pain in left toe(s): Secondary | ICD-10-CM | POA: Diagnosis not present

## 2019-01-16 NOTE — Progress Notes (Signed)
Complaint:  Visit Type: Patient returns to my office for continued preventative foot care services. Complaint: Patient states" my nails have grown long and thick and become painful to walk and wear shoes"The patient presents for preventative foot care services. No changes to ROS  Podiatric Exam: Vascular: dorsalis pedis and posterior tibial pulses are palpable bilateral. Capillary return is immediate. Temperature gradient is WNL. Skin turgor WNL  Sensorium: Normal Semmes Weinstein monofilament test. Normal tactile sensation bilaterally. Nail Exam: Pt has thick disfigured discolored nails with subungual debris noted bilateral entire nail hallux through fifth toenails Ulcer Exam: There is no evidence of ulcer or pre-ulcerative changes or infection. Orthopedic Exam: Muscle tone and strength are WNL. No limitations in general ROM. No crepitus or effusions noted. Foot type and digits show no abnormalities. HAV  B/L. Skin: No Porokeratosis. No infection or ulcers  Diagnosis:  Onychomycosis, , Pain in right toe, pain in left toes  Treatment & Plan Procedures and Treatment: Consent by patient was obtained for treatment procedures.   Debridement of mycotic and hypertrophic toenails, 1 through 5 bilateral and clearing of subungual debris. No ulceration, no infection noted.  Return Visit-Office Procedure: Patient instructed to return to the office for a follow up visit 3 months for continued evaluation and treatment.    Nyesha Cliff DPM 

## 2019-03-28 ENCOUNTER — Encounter: Payer: Self-pay | Admitting: Adult Health

## 2019-03-28 ENCOUNTER — Ambulatory Visit (INDEPENDENT_AMBULATORY_CARE_PROVIDER_SITE_OTHER): Payer: Medicare HMO | Admitting: Adult Health

## 2019-03-28 ENCOUNTER — Ambulatory Visit (INDEPENDENT_AMBULATORY_CARE_PROVIDER_SITE_OTHER): Payer: Medicare HMO

## 2019-03-28 ENCOUNTER — Other Ambulatory Visit: Payer: Self-pay

## 2019-03-28 VITALS — BP 112/74 | Temp 98.5°F | Wt 158.0 lb

## 2019-03-28 DIAGNOSIS — L03115 Cellulitis of right lower limb: Secondary | ICD-10-CM

## 2019-03-28 DIAGNOSIS — Z23 Encounter for immunization: Secondary | ICD-10-CM | POA: Diagnosis not present

## 2019-03-28 DIAGNOSIS — M7989 Other specified soft tissue disorders: Secondary | ICD-10-CM | POA: Diagnosis not present

## 2019-03-28 MED ORDER — DOXYCYCLINE HYCLATE 100 MG PO CAPS: 100.0000 mg | ORAL_CAPSULE | Freq: Two times a day (BID) | ORAL | 0 refills | Status: DC

## 2019-03-28 MED ORDER — PREDNISONE 10 MG PO TABS: ORAL_TABLET | ORAL | 0 refills | Status: DC

## 2019-03-28 NOTE — Progress Notes (Signed)
Subjective:    Patient ID: Frank Rogers, male    DOB: 11/27/1950, 68 y.o.   MRN: [id number]  HPI 68 year old male who presents to the office today for an acute issue of right foot pain, swelling, redness, and warmth x3 days.  He reports that symptoms started in his right great toe.  Over the next 3 days his symptoms continued but the swelling, redness, warmth and pain proceeded through to the rest of his foot.  He has no history of gout.  He denies trauma or aggravating injury.  He denies shortness of breath or chest pain.  He does drink beer multiple times a week.  Pain is worse with ambulation and when sleeping.   Review of Systems See HPI   Past Medical History:  Diagnosis Date  . Arthritis   . Chicken pox   . Migraines   . Seasonal allergies     Social History   Socioeconomic History  . Marital status: Single    Spouse name: Not on file  . Number of children: Not on file  . Years of education: Not on file  . Highest education level: Not on file  Occupational History  . Not on file  Social Needs  . Financial resource strain: Not on file  . Food insecurity    Worry: Not on file    Inability: Not on file  . Transportation needs    Medical: Not on file    Non-medical: Not on file  Tobacco Use  . Smoking status: Never Smoker  . Smokeless tobacco: Never Used  Substance and Sexual Activity  . Alcohol use: Yes    Alcohol/week: 4.0 standard drinks    Types: 4 Cans of beer per week  . Drug use: No  . Sexual activity: Not on file  Lifestyle  . Physical activity    Days per week: Not on file    Minutes per session: Not on file  . Stress: Not on file  Relationships  . Social Herbalist on phone: Not on file    Gets together: Not on file    Attends religious service: Not on file    Active member of club or organization: Not on file    Attends meetings of clubs or organizations: Not on file    Relationship status: Not on file  . Intimate partner  violence    Fear of current or ex partner: Not on file    Emotionally abused: Not on file    Physically abused: Not on file    Forced sexual activity: Not on file  Other Topics Concern  . Not on file  Social History Narrative    He is retired    Not married     History reviewed. No pertinent surgical history.  Family History  Problem Relation Age of Onset  . Arthritis Mother   . Hearing loss Mother   . Stroke Mother   . Diabetes Father   . Hearing loss Father   . Heart attack Sister   . Hyperlipidemia Sister   . Hypertension Sister     No Known Allergies  Current Outpatient Medications on File Prior to Visit  Medication Sig Dispense Refill  . fluticasone (FLONASE) 50 MCG/ACT nasal spray SPRAY 2 SPRAYS INTO EACH NOSTRIL EVERY DAY 48 g 0  . omeprazole (PRILOSEC) 20 MG capsule TAKE 1 CAPSULE BY MOUTH EVERY DAY 90 capsule 1   No current facility-administered medications on file prior to visit.  BP 112/74   Temp 98.5 F (36.9 C)   Wt 158 lb (71.7 kg)   BMI 22.67 kg/m       Objective:   Physical Exam Vitals signs and nursing note reviewed.  Constitutional:      Appearance: Normal appearance.  Cardiovascular:     Rate and Rhythm: Normal rate and regular rhythm.     Pulses: Normal pulses.     Heart sounds: Normal heart sounds.  Pulmonary:     Effort: Pulmonary effort is normal.     Breath sounds: Normal breath sounds.  Musculoskeletal:        General: Swelling and tenderness present.     Right lower leg: Edema present.     Comments: Noticeable redness to right great toe as well as over the dorsal aspect of the right foot radiating to lateral aspect back.  Noticeable nonpitting edema noted to right great foot as well.  He does have tenderness with palpation and warmness is felt.  No calf pain, swelling, warmth, or redness noted.  Skin:    Findings: Erythema present.  Neurological:     General: No focal deficit present.     Mental Status: He is alert.   Psychiatric:        Mood and Affect: Mood normal.        Behavior: Behavior normal.        Thought Content: Thought content normal.        Judgment: Judgment normal.       Assessment & Plan:  1. Cellulitis of right lower extremity -Cellulitis versus gout.  Not concern for DVT at this time.  Will check uric acid and CBC treat with prednisone and doxycycline to cover for both.  We will have him follow-up in 3 days if not significantly improved - doxycycline (VIBRAMYCIN) 100 MG capsule; Take 1 capsule (100 mg total) by mouth 2 (two) times daily.  Dispense: 20 capsule; Refill: 0 - predniSONE (DELTASONE) 10 MG tablet; 40 mg x 3 days, 20 mg x 3 days, 10 mg x 3 days  Dispense: 21 tablet; Refill: 0 - DG Foot Complete Right; Future - CBC with Differential/Platelet - Uric Acid  [name], NP

## 2019-03-28 NOTE — Addendum Note (Signed)
Addended by: Suzette Battiest on: 03/28/2019 04:02 PM   Modules accepted: Orders

## 2019-03-28 NOTE — Addendum Note (Signed)
Addended by: Miles Costain T on: 03/28/2019 03:57 PM   Modules accepted: Orders

## 2019-03-29 LAB — CBC WITH DIFFERENTIAL/PLATELET
Basophils Absolute: 0.1 10*3/uL (ref 0.0–0.1)
Basophils Relative: 1.4 % (ref 0.0–3.0)
Eosinophils Absolute: 0.1 10*3/uL (ref 0.0–0.7)
Eosinophils Relative: 1.8 % (ref 0.0–5.0)
HCT: 39.8 % (ref 39.0–52.0)
Hemoglobin: 13.5 g/dL (ref 13.0–17.0)
Lymphocytes Relative: 25.1 % (ref 12.0–46.0)
Lymphs Abs: 1.7 10*3/uL (ref 0.7–4.0)
MCHC: 34 g/dL (ref 30.0–36.0)
MCV: 89.4 fl (ref 78.0–100.0)
Monocytes Absolute: 0.7 10*3/uL (ref 0.1–1.0)
Monocytes Relative: 10.8 % (ref 3.0–12.0)
Neutro Abs: 4.1 10*3/uL (ref 1.4–7.7)
Neutrophils Relative %: 60.9 % (ref 43.0–77.0)
Platelets: 238 10*3/uL (ref 150.0–400.0)
RBC: 4.46 Mil/uL (ref 4.22–5.81)
RDW: 13 % (ref 11.5–15.5)
WBC: 6.7 10*3/uL (ref 4.0–10.5)

## 2019-03-29 LAB — URIC ACID: Uric Acid, Serum: 7.6 mg/dL (ref 4.0–7.8)

## 2019-04-15 ENCOUNTER — Ambulatory Visit: Payer: Medicare HMO | Admitting: Podiatry

## 2019-04-15 ENCOUNTER — Encounter

## 2019-04-17 ENCOUNTER — Ambulatory Visit: Payer: Medicare HMO | Admitting: Podiatry

## 2019-04-17 ENCOUNTER — Encounter: Payer: Self-pay | Admitting: Podiatry

## 2019-04-17 ENCOUNTER — Other Ambulatory Visit: Payer: Self-pay

## 2019-04-17 DIAGNOSIS — B351 Tinea unguium: Secondary | ICD-10-CM | POA: Diagnosis not present

## 2019-04-17 DIAGNOSIS — M79675 Pain in left toe(s): Secondary | ICD-10-CM | POA: Diagnosis not present

## 2019-04-17 DIAGNOSIS — M79674 Pain in right toe(s): Secondary | ICD-10-CM | POA: Diagnosis not present

## 2019-04-17 NOTE — Progress Notes (Signed)
Complaint:  Visit Type: Patient returns to my office for continued preventative foot care services. Complaint: Patient states" my nails have grown long and thick and become painful to walk and wear shoes"  The patient presents for preventative foot care services. No changes to ROS  Podiatric Exam: Vascular: dorsalis pedis and posterior tibial pulses are palpable bilateral. Capillary return is immediate. Temperature gradient is WNL. Skin turgor WNL  Sensorium: Normal Semmes Weinstein monofilament test. Normal tactile sensation bilaterally. Nail Exam: Pt has thick disfigured discolored nails with subungual debris noted bilateral entire nail hallux through fifth toenails Ulcer Exam: There is no evidence of ulcer or pre-ulcerative changes or infection. Orthopedic Exam: Muscle tone and strength are WNL. No limitations in general ROM. No crepitus or effusions noted. Foot type and digits show no abnormalities. HAV  B/L. Skin: No Porokeratosis. No infection or ulcers  Diagnosis:  Onychomycosis, , Pain in right toe, pain in left toes  Treatment & Plan Procedures and Treatment: Consent by patient was obtained for treatment procedures.   Debridement of mycotic and hypertrophic toenails, 1 through 5 bilateral and clearing of subungual debris. No ulceration, no infection noted.  Return Visit-Office Procedure: Patient instructed to return to the office for a follow up visit 3 months for continued evaluation and treatment.    Gardiner Barefoot DPM

## 2019-06-05 ENCOUNTER — Other Ambulatory Visit: Payer: Self-pay | Admitting: Adult Health

## 2019-07-17 ENCOUNTER — Encounter: Payer: Self-pay | Admitting: Podiatry

## 2019-07-17 ENCOUNTER — Ambulatory Visit: Payer: Medicare HMO | Admitting: Podiatry

## 2019-07-17 ENCOUNTER — Other Ambulatory Visit: Payer: Self-pay

## 2019-07-17 VITALS — Temp 97.8°F

## 2019-07-17 DIAGNOSIS — M79674 Pain in right toe(s): Secondary | ICD-10-CM | POA: Diagnosis not present

## 2019-07-17 DIAGNOSIS — M79675 Pain in left toe(s): Secondary | ICD-10-CM | POA: Diagnosis not present

## 2019-07-17 DIAGNOSIS — B351 Tinea unguium: Secondary | ICD-10-CM | POA: Diagnosis not present

## 2019-07-17 NOTE — Progress Notes (Signed)
This patient returns to the office for evaluation and treatment of long thick painful nails .  This patient is unable to trim his own nails since the patient cannot reach the feet.  Patient says the nails are painful walking and wearing his shoes.  He returns for preventive foot care services.  General Appearance  Alert, conversant and in no acute stress.  Vascular  Dorsalis pedis and posterior tibial  pulses are palpable  bilaterally.  Capillary return is within normal limits  bilaterally. Temperature is within normal limits  bilaterally.  Neurologic  Senn-Weinstein monofilament wire test within normal limits  bilaterally. Muscle power within normal limits bilaterally.  Nails Thick disfigured discolored nails with subungual debris  from hallux to fifth toes bilaterally. No evidence of bacterial infection or drainage bilaterally.  Orthopedic  No limitations of motion  feet .  No crepitus or effusions noted.  No bony pathology or digital deformities noted.  HAV  B/L.  Skin  normotropic skin with no porokeratosis noted bilaterally.  No signs of infections or ulcers noted.     Onychomycosis  Pain in toes right foot  Pain in toes left foot  Debridement  of nails  1-5  B/L with a nail nipper.  Nails were then filed using a dremel tool with no incidents.    RTC  3 months    Enisa Runyan DPM  

## 2019-07-29 ENCOUNTER — Telehealth (INDEPENDENT_AMBULATORY_CARE_PROVIDER_SITE_OTHER): Payer: Medicare HMO | Admitting: Family Medicine

## 2019-07-29 ENCOUNTER — Other Ambulatory Visit: Payer: Self-pay

## 2019-07-29 DIAGNOSIS — J069 Acute upper respiratory infection, unspecified: Secondary | ICD-10-CM

## 2019-07-29 NOTE — Progress Notes (Signed)
Virtual Visit via Video Note  I connected with the patient on 07/29/19 at 11:30 AM EDT by a video enabled telemedicine application and verified that I am speaking with the correct person using two identifiers.  Location patient: home Location provider:work or home office Persons participating in the virtual visit: patient, provider  I discussed the limitations of evaluation and management by telemedicine and the availability of in person appointments. The patient expressed understanding and agreed to proceed.   HPI: Here with his son for 3 days of stuffy head, PND, and a dry cough. No fever or headache or loss of taste or smell. No body aches or NVD. No chest pain or SOB. He is using Flonase as usual.    ROS: See pertinent positives and negatives per HPI.  Past Medical History:  Diagnosis Date  . Arthritis   . Chicken pox   . Migraines   . Seasonal allergies     No past surgical history on file.  Family History  Problem Relation Age of Onset  . Arthritis Mother   . Hearing loss Mother   . Stroke Mother   . Diabetes Father   . Hearing loss Father   . Heart attack Sister   . Hyperlipidemia Sister   . Hypertension Sister      Current Outpatient Medications:  .  fluticasone (FLONASE) 50 MCG/ACT nasal spray, SPRAY 2 SPRAYS INTO EACH NOSTRIL EVERY DAY, Disp: 48 g, Rfl: 0 .  omeprazole (PRILOSEC) 20 MG capsule, TAKE 1 CAPSULE BY MOUTH EVERY DAY, Disp: 90 capsule, Rfl: 1 .  doxycycline (VIBRAMYCIN) 100 MG capsule, Take 1 capsule (100 mg total) by mouth 2 (two) times daily. (Patient not taking: Reported on 07/29/2019), Disp: 20 capsule, Rfl: 0 .  predniSONE (DELTASONE) 10 MG tablet, 40 mg x 3 days, 20 mg x 3 days, 10 mg x 3 days (Patient not taking: Reported on 07/29/2019), Disp: 21 tablet, Rfl: 0  EXAM:  VITALS per patient if applicable:  GENERAL: alert, oriented, appears well and in no acute distress  HEENT: atraumatic, conjunttiva clear, no obvious abnormalities on  inspection of external nose and ears  NECK: normal movements of the head and neck  LUNGS: on inspection no signs of respiratory distress, breathing rate appears normal, no obvious gross SOB, gasping or wheezing  CV: no obvious cyanosis  MS: moves all visible extremities without noticeable abnormality  PSYCH/NEURO: pleasant and cooperative, no obvious depression or anxiety, speech and thought processing grossly intact  ASSESSMENT AND PLAN: Viral URI. Drink plenty of fluids and add Mucinex BID as needed. Alysia Penna, MD  Discussed the following assessment and plan:  No diagnosis found.     I discussed the assessment and treatment plan with the patient. The patient was provided an opportunity to ask questions and all were answered. The patient agreed with the plan and demonstrated an understanding of the instructions.   The patient was advised to call back or seek an in-person evaluation if the symptoms worsen or if the condition fails to improve as anticipated.

## 2019-08-05 ENCOUNTER — Ambulatory Visit (INDEPENDENT_AMBULATORY_CARE_PROVIDER_SITE_OTHER): Payer: Medicare HMO | Admitting: Family Medicine

## 2019-08-05 ENCOUNTER — Encounter: Payer: Self-pay | Admitting: Family Medicine

## 2019-08-05 ENCOUNTER — Other Ambulatory Visit: Payer: Self-pay

## 2019-08-05 VITALS — BP 116/64 | HR 86 | Temp 98.0°F | Wt 166.8 lb

## 2019-08-05 DIAGNOSIS — M79675 Pain in left toe(s): Secondary | ICD-10-CM | POA: Diagnosis not present

## 2019-08-05 MED ORDER — CEPHALEXIN 500 MG PO CAPS: 500.0000 mg | ORAL_CAPSULE | Freq: Four times a day (QID) | ORAL | 0 refills | Status: DC

## 2019-08-05 MED ORDER — PREDNISONE 10 MG PO TABS: ORAL_TABLET | ORAL | 0 refills | Status: DC

## 2019-08-05 NOTE — Progress Notes (Signed)
  Subjective:     Patient ID: Frank Rogers, male   DOB: 04/11/1951, 69 y.o.   MRN: [id number]  HPI Frank Rogers seen with swollen left great toe for the past couple days.  Started on Saturday.  No injury.  He had similar acute swelling and redness right foot last year with question of cellulitis versus gout.  He was placed at that time on prednisone and doxycycline and did improve promptly.  Has had no fever.  No chills.  No obvious breaks in the skin.  Last year during flareup he had CBC with normal white count and uric acid level was 7.6.  He does not take any regular medications.  No known drug allergies.  Past Medical History:  Diagnosis Date  . Arthritis   . Chicken pox   . Migraines   . Seasonal allergies    No past surgical history on file.  reports that he has never smoked. He has never used smokeless tobacco. He reports current alcohol use of about 4.0 standard drinks of alcohol per week. He reports that he does not use drugs. family history includes Arthritis in his mother; Diabetes in his father; Hearing loss in his father and mother; Heart attack in his sister; Hyperlipidemia in his sister; Hypertension in his sister; Stroke in his mother. No Known Allergies   Review of Systems  Constitutional: Negative for chills and fever.  Gastrointestinal: Negative for nausea and vomiting.       Objective:   Physical Exam Vitals reviewed.  Constitutional:      Appearance: Normal appearance.  Cardiovascular:     Rate and Rhythm: Normal rate and regular rhythm.  Skin:    Comments: Left great toe reveals fairly diffuse erythema and swelling and warmth.  No obvious breaks in the skin.  Good capillary refill throughout.  Good distal pulses.  Neurological:     Mental Status: He is alert.        Assessment:     Acute inflammatory changes left great toe.  Suspect this is probably acute gout with similar history of prior involvement right foot but cannot rule out cellulitis  changes as well    Plan:     -Decided to go ahead and cover with prednisone taper but also start Keflex 500 mg 4 times daily for 7 days.  Elevate toe frequently.  -Follow-up promptly for any fever or progressive redness or swelling.  -We discussed potential dietary triggers for gout.  We have advised increased hydration.  We also suggest he follow-up with primary after this resolves to discuss possible repeat uric acid when he is not having flareup.  If uric acid is increasing further consider prophylactic medication such as allopurinol  Frank Post MD [facility]

## 2019-08-05 NOTE — Patient Instructions (Signed)
Elevate foot frequently  Follow up for any fever or increased redness.

## 2019-08-27 DIAGNOSIS — R972 Elevated prostate specific antigen [PSA]: Secondary | ICD-10-CM | POA: Diagnosis not present

## 2019-08-27 DIAGNOSIS — R310 Gross hematuria: Secondary | ICD-10-CM | POA: Diagnosis not present

## 2019-09-06 DIAGNOSIS — R310 Gross hematuria: Secondary | ICD-10-CM | POA: Diagnosis not present

## 2019-09-06 DIAGNOSIS — N200 Calculus of kidney: Secondary | ICD-10-CM | POA: Diagnosis not present

## 2019-09-09 ENCOUNTER — Other Ambulatory Visit: Payer: Self-pay | Admitting: Nurse Practitioner

## 2019-09-09 DIAGNOSIS — R310 Gross hematuria: Secondary | ICD-10-CM

## 2019-09-09 DIAGNOSIS — D4102 Neoplasm of uncertain behavior of left kidney: Secondary | ICD-10-CM

## 2019-09-23 ENCOUNTER — Ambulatory Visit (HOSPITAL_COMMUNITY)
Admission: RE | Admit: 2019-09-23 | Discharge: 2019-09-23 | Disposition: A | Discharge status: Home / Self Care | Payer: Medicare HMO | Source: Ambulatory Visit | Attending: Nurse Practitioner | Admitting: Nurse Practitioner

## 2019-09-23 ENCOUNTER — Other Ambulatory Visit: Payer: Self-pay

## 2019-09-23 DIAGNOSIS — N200 Calculus of kidney: Secondary | ICD-10-CM | POA: Diagnosis not present

## 2019-09-23 DIAGNOSIS — R310 Gross hematuria: Secondary | ICD-10-CM | POA: Diagnosis not present

## 2019-09-23 DIAGNOSIS — D4102 Neoplasm of uncertain behavior of left kidney: Secondary | ICD-10-CM | POA: Diagnosis not present

## 2019-09-23 MED ORDER — GADOBUTROL 1 MMOL/ML IV SOLN
7.0000 mL | Freq: Once | INTRAVENOUS | Status: AC | PRN
  Administered 2019-09-23: 7 mL via INTRAVENOUS

## 2019-09-26 DIAGNOSIS — R310 Gross hematuria: Secondary | ICD-10-CM | POA: Diagnosis not present

## 2019-10-01 ENCOUNTER — Encounter: Payer: Self-pay | Admitting: Family Medicine

## 2019-10-16 ENCOUNTER — Encounter: Payer: Self-pay | Admitting: Podiatry

## 2019-10-16 ENCOUNTER — Ambulatory Visit: Payer: Medicare HMO | Admitting: Podiatry

## 2019-10-16 ENCOUNTER — Other Ambulatory Visit: Payer: Self-pay

## 2019-10-16 DIAGNOSIS — M79674 Pain in right toe(s): Secondary | ICD-10-CM

## 2019-10-16 DIAGNOSIS — B351 Tinea unguium: Secondary | ICD-10-CM | POA: Diagnosis not present

## 2019-10-16 DIAGNOSIS — M79675 Pain in left toe(s): Secondary | ICD-10-CM

## 2019-10-16 NOTE — Progress Notes (Signed)
This patient returns to the office for evaluation and treatment of long thick painful nails .  This patient is unable to trim his own nails since the patient cannot reach his feet.  Patient says the nails are painful walking and wearing his shoes.  He returns for preventive foot care services.  General Appearance  Alert, conversant and in no acute stress.  Vascular  Dorsalis pedis and posterior tibial  pulses are palpable  bilaterally.  Capillary return is within normal limits  bilaterally. Temperature is within normal limits  bilaterally.  Neurologic  Senn-Weinstein monofilament wire test within normal limits  bilaterally. Muscle power within normal limits bilaterally.  Nails Thick disfigured discolored nails with subungual debris  from hallux to fifth toes bilaterally. No evidence of bacterial infection or drainage bilaterally.  Orthopedic  No limitations of motion  feet .  No crepitus or effusions noted.  No bony pathology or digital deformities noted.  HAV  B/L.    Skin  normotropic skin with no porokeratosis noted bilaterally.  No signs of infections or ulcers noted.     Onychomycosis  Pain in toes right foot  Pain in toes left foot  Debridement  of nails  1-5  B/L with a nail nipper.  Nails were then filed using a dremel tool with no incidents. RTC 3 months.   Raiquan Chandler DPM   

## 2019-10-24 ENCOUNTER — Other Ambulatory Visit: Payer: Self-pay

## 2019-10-25 ENCOUNTER — Other Ambulatory Visit (INDEPENDENT_AMBULATORY_CARE_PROVIDER_SITE_OTHER): Payer: Medicare HMO

## 2019-10-25 ENCOUNTER — Ambulatory Visit (INDEPENDENT_AMBULATORY_CARE_PROVIDER_SITE_OTHER)
Admission: RE | Admit: 2019-10-25 | Discharge: 2019-10-25 | Disposition: A | Discharge status: Home / Self Care | Payer: Medicare HMO | Source: Ambulatory Visit | Attending: Adult Health | Admitting: Adult Health

## 2019-10-25 ENCOUNTER — Encounter: Payer: Self-pay | Admitting: Adult Health

## 2019-10-25 ENCOUNTER — Ambulatory Visit (INDEPENDENT_AMBULATORY_CARE_PROVIDER_SITE_OTHER): Payer: Medicare HMO | Admitting: Adult Health

## 2019-10-25 ENCOUNTER — Other Ambulatory Visit: Payer: Self-pay | Admitting: Family Medicine

## 2019-10-25 VITALS — BP 122/76 | Temp 98.3°F | Wt 162.0 lb

## 2019-10-25 DIAGNOSIS — S59901A Unspecified injury of right elbow, initial encounter: Secondary | ICD-10-CM | POA: Diagnosis not present

## 2019-10-25 DIAGNOSIS — M25562 Pain in left knee: Secondary | ICD-10-CM

## 2019-10-25 DIAGNOSIS — M25521 Pain in right elbow: Secondary | ICD-10-CM

## 2019-10-25 DIAGNOSIS — M25462 Effusion, left knee: Secondary | ICD-10-CM | POA: Diagnosis not present

## 2019-10-25 LAB — CBC WITH DIFFERENTIAL/PLATELET
Basophils Absolute: 0 10*3/uL (ref 0.0–0.1)
Basophils Relative: 0.6 % (ref 0.0–3.0)
Eosinophils Absolute: 0 10*3/uL (ref 0.0–0.7)
Eosinophils Relative: 0.7 % (ref 0.0–5.0)
HCT: 36.4 % — ABNORMAL LOW (ref 39.0–52.0)
Hemoglobin: 12.6 g/dL — ABNORMAL LOW (ref 13.0–17.0)
Lymphocytes Relative: 31.5 % (ref 12.0–46.0)
Lymphs Abs: 1.8 10*3/uL (ref 0.7–4.0)
MCHC: 34.6 g/dL (ref 30.0–36.0)
MCV: 86 fl (ref 78.0–100.0)
Monocytes Absolute: 0.8 10*3/uL (ref 0.1–1.0)
Monocytes Relative: 14.2 % — ABNORMAL HIGH (ref 3.0–12.0)
Neutro Abs: 3 10*3/uL (ref 1.4–7.7)
Neutrophils Relative %: 53 % (ref 43.0–77.0)
Platelets: 225 10*3/uL (ref 150.0–400.0)
RBC: 4.24 Mil/uL (ref 4.22–5.81)
RDW: 13.1 % (ref 11.5–15.5)
WBC: 5.6 10*3/uL (ref 4.0–10.5)

## 2019-10-25 LAB — URIC ACID: Uric Acid, Serum: 6.5 mg/dL (ref 4.0–7.8)

## 2019-10-25 NOTE — Progress Notes (Signed)
Subjective:    Patient ID: Frank Rogers, male    DOB: 25-Dec-1950, 69 y.o.   MRN: [id number]  HPI 69 year old male who  has a past medical history of Arthritis, Chicken pox, Migraines, and Seasonal allergies.  He presents to the office today for for an acute issue of right elbow and left  Knee pain.   Right elbow pain - he reports that pain started about 3 weeks ago after he fell and hit his elbow and right knee on cement. His caregiver reports that 2 weeks ago the elbow became swollen, red, and warm but this has since resolved but elbow continues to be swollen and painful. He has limited range of motion and is unable to straighten the right arm.   Left knee pain - reports pain started yesterday. Pain is located throughout the knee. More painful when he walks or changes position. He denies injury to the left knee    Review of Systems See HPI   Past Medical History:  Diagnosis Date  . Arthritis   . Chicken pox   . Migraines   . Seasonal allergies     Social History   Socioeconomic History  . Marital status: Single    Spouse name: Not on file  . Number of children: Not on file  . Years of education: Not on file  . Highest education level: Not on file  Occupational History  . Not on file  Tobacco Use  . Smoking status: Never Smoker  . Smokeless tobacco: Never Used  Vaping Use  . Vaping Use: Never used  Substance and Sexual Activity  . Alcohol use: Yes    Alcohol/week: 4.0 standard drinks    Types: 4 Cans of beer per week  . Drug use: No  . Sexual activity: Not on file  Other Topics Concern  . Not on file  Social History Narrative    He is retired    Not married    Social Determinants of Radio broadcast assistant Strain:   . Difficulty of Paying Living Expenses:   Food Insecurity:   . Worried About Charity fundraiser in the Last Year:   . Arboriculturist in the Last Year:   Transportation Needs:   . Film/video editor (Medical):   Marland Kitchen Lack of  Transportation (Non-Medical):   Physical Activity:   . Days of Exercise per Week:   . Minutes of Exercise per Session:   Stress:   . Feeling of Stress :   Social Connections:   . Frequency of Communication with Friends and Family:   . Frequency of Social Gatherings with Friends and Family:   . Attends Religious Services:   . Active Member of Clubs or Organizations:   . Attends Archivist Meetings:   Marland Kitchen Marital Status:   Intimate Partner Violence:   . Fear of Current or Ex-Partner:   . Emotionally Abused:   Marland Kitchen Physically Abused:   . Sexually Abused:     No past surgical history on file.  Family History  Problem Relation Age of Onset  . Arthritis Mother   . Hearing loss Mother   . Stroke Mother   . Diabetes Father   . Hearing loss Father   . Heart attack Sister   . Hyperlipidemia Sister   . Hypertension Sister     No Known Allergies  Current Outpatient Medications on File Prior to Visit  Medication Sig Dispense Refill  . ampicillin (  PRINCIPEN) 500 MG capsule     . cephALEXin (KEFLEX) 500 MG capsule Take 1 capsule (500 mg total) by mouth 4 (four) times daily. 28 capsule 0  . fluticasone (FLONASE) 50 MCG/ACT nasal spray SPRAY 2 SPRAYS INTO EACH NOSTRIL EVERY DAY 48 g 0  . omeprazole (PRILOSEC) 20 MG capsule TAKE 1 CAPSULE BY MOUTH EVERY DAY 90 capsule 1  . predniSONE (DELTASONE) 10 MG tablet TAPER AS FOLLOWS: 4-4-4-3-3-2-2 22 tablet 0   No current facility-administered medications on file prior to visit.    BP 122/76   Temp 98.3 F (36.8 C)   Wt 162 lb (73.5 kg)   BMI 23.24 kg/m       Objective:   Physical Exam Vitals and nursing note reviewed.  Musculoskeletal:        General: Swelling and tenderness present.     Right elbow: Swelling present. Decreased range of motion. Tenderness present in radial head, medial epicondyle, lateral epicondyle and olecranon process.     Right knee: Normal.     Left knee: Swelling and bony tenderness present. No  effusion, erythema or crepitus. Normal range of motion. No tenderness. Normal alignment.     Comments: Swelling noted throughout right elbow but most notable along medial aspect into tricep. No effusion felt. No redness or warmth noted. .   Skin:    General: Skin is warm and dry.  Neurological:     General: No focal deficit present.     Mental Status: He is alert and oriented to person, place, and time.  Psychiatric:        Mood and Affect: Mood normal.        Behavior: Behavior normal.        Thought Content: Thought content normal.        Judgment: Judgment normal.       Assessment & Plan:  1. Right elbow pain - Concern for fracture or soft tissue injury.  - Does not appear to be bursitis.  - DG Elbow Complete Right; Future - Uric Acid - CBC with Differential/Platelet  2. Acute pain of left knee - Appears as arthritis. No effusion felt but does have soft tissue swelling throughout left knee. Doubt gout.  - DG Knee 3 Views Left; Future - Uric Acid - CBC with Differential/Platelet   [name], NP

## 2019-11-01 ENCOUNTER — Encounter: Payer: Self-pay | Admitting: Orthopaedic Surgery

## 2019-11-01 ENCOUNTER — Ambulatory Visit: Payer: Medicare HMO | Admitting: Orthopaedic Surgery

## 2019-11-01 DIAGNOSIS — M1712 Unilateral primary osteoarthritis, left knee: Secondary | ICD-10-CM | POA: Diagnosis not present

## 2019-11-01 DIAGNOSIS — M19021 Primary osteoarthritis, right elbow: Secondary | ICD-10-CM

## 2019-11-01 MED ORDER — DICLOFENAC SODIUM 75 MG PO TBEC
75.0000 mg | DELAYED_RELEASE_TABLET | Freq: Two times a day (BID) | ORAL | 2 refills | Status: DC
Start: 2019-11-01 — End: 2020-09-08

## 2019-11-01 MED ORDER — METHYLPREDNISOLONE 4 MG PO TBPK: ORAL_TABLET | ORAL | 0 refills | Status: DC

## 2019-11-01 NOTE — Progress Notes (Signed)
Office Visit Note   Patient: Frank Rogers           Date of Birth: June 06, 1950           MRN: [id number] Visit Date: 11/01/2019              Requested by: [name], NP [address],  [place] [id number] PCP: [name], NP   Assessment & Plan: Visit Diagnoses:  1. Primary osteoarthritis of left knee   2. Primary osteoarthritis of right elbow     Plan: Impression is degenerative joint disease left knee and right elbow.  Patient declined injections and would rather just start with some medications.  Medrol Dosepak and diclofenac for sent in today.  We will see him back as needed.  Follow-Up Instructions: Return if symptoms worsen or fail to improve.   Orders:  No orders of the defined types were placed in this encounter.  Meds ordered this encounter  Medications  . methylPREDNISolone (MEDROL DOSEPAK) 4 MG TBPK tablet    Sig: Use as directed    Dispense:  21 tablet    Refill:  0  . diclofenac (VOLTAREN) 75 MG EC tablet    Sig: Take 1 tablet (75 mg total) by mouth 2 (two) times daily.    Dispense:  30 tablet    Refill:  2      Procedures: No procedures performed   Clinical Data: No additional findings.   Subjective: Chief Complaint  Patient presents with  . Left Knee - Pain  . Right Elbow - Pain    Frank Rogers is here today for evaluation of her chronic left knee pain and right elbow pain.  He is accompanied by his son.  He denies any injuries.  He has a swelling that comes and goes.  He has trouble walking due to the left knee pain and he has pain in his right elbow that feels warm to touch.  He denies a history of gout.  Denies any injuries.   Review of Systems  Constitutional: Negative.   All other systems reviewed and are negative.    Objective: Vital Signs: There were no vitals taken for this visit.  Physical Exam Vitals and nursing note reviewed.  Constitutional:      Appearance: He is well-developed.  HENT:      Head: Normocephalic and atraumatic.  Eyes:     Pupils: Pupils are equal, round, and reactive to light.  Pulmonary:     Effort: Pulmonary effort is normal.  Abdominal:     Palpations: Abdomen is soft.  Musculoskeletal:        General: Normal range of motion.     Cervical back: Neck supple.  Skin:    General: Skin is warm.  Neurological:     Mental Status: He is alert and oriented to person, place, and time.  Psychiatric:        Behavior: Behavior normal.        Thought Content: Thought content normal.        Judgment: Judgment normal.     Ortho Exam Right elbow shows 25 degree flexion contracture.  Mild pain with elbow range of motion.  No signs of infection.  Neurovascular intact distally. Left knee shows no effusion.  Painful range of motion with crepitus.  Range of motion is mildly restricted. Specialty Comments:  No specialty comments available.  Imaging: No results found.   PMFS History: Patient Active Problem List   Diagnosis Date  Noted  . BPH (benign prostatic hyperplasia) 04/14/2017   Past Medical History:  Diagnosis Date  . Arthritis   . Chicken pox   . Migraines   . Seasonal allergies     Family History  Problem Relation Age of Onset  . Arthritis Mother   . Hearing loss Mother   . Stroke Mother   . Diabetes Father   . Hearing loss Father   . Heart attack Sister   . Hyperlipidemia Sister   . Hypertension Sister     History reviewed. No pertinent surgical history. Social History   Occupational History  . Not on file  Tobacco Use  . Smoking status: Never Smoker  . Smokeless tobacco: Never Used  Vaping Use  . Vaping Use: Never used  Substance and Sexual Activity  . Alcohol use: Yes    Alcohol/week: 4.0 standard drinks    Types: 4 Cans of beer per week  . Drug use: No  . Sexual activity: Not on file

## 2019-12-27 DIAGNOSIS — R310 Gross hematuria: Secondary | ICD-10-CM | POA: Diagnosis not present

## 2020-01-13 ENCOUNTER — Encounter (HOSPITAL_COMMUNITY): Payer: Self-pay | Admitting: Emergency Medicine

## 2020-01-13 ENCOUNTER — Emergency Department (HOSPITAL_COMMUNITY): Payer: Medicare HMO

## 2020-01-13 ENCOUNTER — Other Ambulatory Visit: Payer: Self-pay

## 2020-01-13 ENCOUNTER — Emergency Department (HOSPITAL_COMMUNITY)
Admission: EM | Admit: 2020-01-13 | Discharge: 2020-01-13 | Disposition: A | Discharge status: Home / Self Care | Payer: Medicare HMO | Attending: Emergency Medicine | Admitting: Emergency Medicine

## 2020-01-13 DIAGNOSIS — R109 Unspecified abdominal pain: Secondary | ICD-10-CM | POA: Insufficient documentation

## 2020-01-13 DIAGNOSIS — R0602 Shortness of breath: Secondary | ICD-10-CM

## 2020-01-13 DIAGNOSIS — T782XXA Anaphylactic shock, unspecified, initial encounter: Secondary | ICD-10-CM | POA: Diagnosis not present

## 2020-01-13 DIAGNOSIS — R0902 Hypoxemia: Secondary | ICD-10-CM | POA: Diagnosis not present

## 2020-01-13 DIAGNOSIS — R42 Dizziness and giddiness: Secondary | ICD-10-CM | POA: Diagnosis not present

## 2020-01-13 DIAGNOSIS — R519 Headache, unspecified: Secondary | ICD-10-CM | POA: Diagnosis not present

## 2020-01-13 DIAGNOSIS — I959 Hypotension, unspecified: Secondary | ICD-10-CM | POA: Diagnosis not present

## 2020-01-13 DIAGNOSIS — S199XXA Unspecified injury of neck, initial encounter: Secondary | ICD-10-CM | POA: Diagnosis not present

## 2020-01-13 DIAGNOSIS — M4802 Spinal stenosis, cervical region: Secondary | ICD-10-CM | POA: Diagnosis not present

## 2020-01-13 DIAGNOSIS — L509 Urticaria, unspecified: Secondary | ICD-10-CM | POA: Diagnosis not present

## 2020-01-13 DIAGNOSIS — Z79899 Other long term (current) drug therapy: Secondary | ICD-10-CM | POA: Diagnosis not present

## 2020-01-13 DIAGNOSIS — G9389 Other specified disorders of brain: Secondary | ICD-10-CM | POA: Diagnosis not present

## 2020-01-13 DIAGNOSIS — R55 Syncope and collapse: Secondary | ICD-10-CM | POA: Diagnosis not present

## 2020-01-13 DIAGNOSIS — S0990XA Unspecified injury of head, initial encounter: Secondary | ICD-10-CM | POA: Diagnosis not present

## 2020-01-13 DIAGNOSIS — I6522 Occlusion and stenosis of left carotid artery: Secondary | ICD-10-CM | POA: Diagnosis not present

## 2020-01-13 LAB — HEPATIC FUNCTION PANEL
ALT: 19 U/L (ref 0–44)
AST: 18 U/L (ref 15–41)
Albumin: 3.9 g/dL (ref 3.5–5.0)
Alkaline Phosphatase: 57 U/L (ref 38–126)
Bilirubin, Direct: 0.2 mg/dL (ref 0.0–0.2)
Indirect Bilirubin: 0.7 mg/dL (ref 0.3–0.9)
Total Bilirubin: 0.9 mg/dL (ref 0.3–1.2)
Total Protein: 6.9 g/dL (ref 6.5–8.1)

## 2020-01-13 LAB — CBC WITH DIFFERENTIAL/PLATELET
Abs Immature Granulocytes: 0.07 10*3/uL (ref 0.00–0.07)
Basophils Absolute: 0 10*3/uL (ref 0.0–0.1)
Basophils Relative: 0 %
Eosinophils Absolute: 0.1 10*3/uL (ref 0.0–0.5)
Eosinophils Relative: 0 %
HCT: 45.8 % (ref 39.0–52.0)
Hemoglobin: 16 g/dL (ref 13.0–17.0)
Immature Granulocytes: 0 %
Lymphocytes Relative: 11 %
Lymphs Abs: 1.6 10*3/uL (ref 0.7–4.0)
MCH: 30.1 pg (ref 26.0–34.0)
MCHC: 34.9 g/dL (ref 30.0–36.0)
MCV: 86.1 fL (ref 80.0–100.0)
Monocytes Absolute: 1 10*3/uL (ref 0.1–1.0)
Monocytes Relative: 6 %
Neutro Abs: 12.8 10*3/uL — ABNORMAL HIGH (ref 1.7–7.7)
Neutrophils Relative %: 83 %
Platelets: 249 10*3/uL (ref 150–400)
RBC: 5.32 MIL/uL (ref 4.22–5.81)
RDW: 12.8 % (ref 11.5–15.5)
WBC: 15.6 10*3/uL — ABNORMAL HIGH (ref 4.0–10.5)
nRBC: 0 % (ref 0.0–0.2)

## 2020-01-13 LAB — BASIC METABOLIC PANEL
Anion gap: 12 (ref 5–15)
BUN: 22 mg/dL (ref 8–23)
CO2: 22 mmol/L (ref 22–32)
Calcium: 8.8 mg/dL — ABNORMAL LOW (ref 8.9–10.3)
Chloride: 106 mmol/L (ref 98–111)
Creatinine, Ser: 1.37 mg/dL — ABNORMAL HIGH (ref 0.61–1.24)
GFR calc Af Amer: >60 mL/min (ref >60)
GFR calc non Af Amer: 53 mL/min — ABNORMAL LOW (ref >60)
Glucose, Bld: 189 mg/dL — ABNORMAL HIGH (ref 70–99)
Potassium: 3.4 mmol/L — ABNORMAL LOW (ref 3.5–5.1)
Sodium: 140 mmol/L (ref 135–145)

## 2020-01-13 LAB — LIPASE, BLOOD: Lipase: 30 U/L (ref 11–51)

## 2020-01-13 MED ORDER — PREDNISONE 20 MG PO TABS
40.0000 mg | ORAL_TABLET | Freq: Every day | ORAL | 0 refills | Status: AC
Start: 2020-01-13 — End: 2020-01-16

## 2020-01-13 MED ORDER — SODIUM CHLORIDE 0.9 % IV BOLUS
500.0000 mL | Freq: Once | INTRAVENOUS | Status: AC
  Administered 2020-01-13: 500 mL via INTRAVENOUS

## 2020-01-13 MED ORDER — SODIUM CHLORIDE 0.9 % IV BOLUS
1000.0000 mL | Freq: Once | INTRAVENOUS | Status: AC
  Administered 2020-01-13: 1000 mL via INTRAVENOUS

## 2020-01-13 MED ORDER — EPINEPHRINE 0.3 MG/0.3ML IJ SOAJ: 0.3000 mg | INTRAMUSCULAR | 0 refills | Status: DC | PRN

## 2020-01-13 MED ORDER — METHYLPREDNISOLONE SODIUM SUCC 125 MG IJ SOLR
125.0000 mg | Freq: Once | INTRAMUSCULAR | Status: AC
  Administered 2020-01-13: 125 mg via INTRAVENOUS
  Filled 2020-01-13: qty 2

## 2020-01-13 MED ORDER — FAMOTIDINE IN NACL 20-0.9 MG/50ML-% IV SOLN
20.0000 mg | Freq: Once | INTRAVENOUS | Status: AC
  Administered 2020-01-13: 20 mg via INTRAVENOUS
  Filled 2020-01-13: qty 50

## 2020-01-13 NOTE — ED Notes (Addendum)
Pt stated he felt like he was going to pass out. Pt became very diaphoretic and BP decreased to 86/67. All other vitals stable. MD made aware and at bedside.

## 2020-01-13 NOTE — ED Provider Notes (Addendum)
[facility] DEPT Provider Note   CSN: [id number] Arrival date & time: 01/13/20  1944     History Chief Complaint  Patient presents with  . Hypotension  . Loss of Consciousness  . Allergic Reaction    Frank Rogers is a 69 y.o. male.  The history is provided by the EMS personnel, the patient and a caregiver.  Loss of Consciousness Episode history:  Single Most recent episode:  Today Progression:  Resolved Chronicity:  New Context: standing up (fell and hit his head)   Witnessed: yes   Relieved by: epiephrine, benadryl. Ineffective treatments:  None tried Associated symptoms: no chest pain, no fever, no palpitations, no seizures, no shortness of breath and no vomiting   Allergic Reaction Presenting symptoms: rash        Past Medical History:  Diagnosis Date  . Arthritis   . Chicken pox   . Migraines   . Seasonal allergies     Patient Active Problem List   Diagnosis Date Noted  . BPH (benign prostatic hyperplasia) 04/14/2017    History reviewed. No pertinent surgical history.     Family History  Problem Relation Age of Onset  . Arthritis Mother   . Hearing loss Mother   . Stroke Mother   . Diabetes Father   . Hearing loss Father   . Heart attack Sister   . Hyperlipidemia Sister   . Hypertension Sister     Social History   Tobacco Use  . Smoking status: Never Smoker  . Smokeless tobacco: Never Used  Vaping Use  . Vaping Use: Never used  Substance Use Topics  . Alcohol use: Yes    Alcohol/week: 4.0 standard drinks    Types: 4 Cans of beer per week  . Drug use: No    Home Medications Prior to Admission medications   Medication Sig Start Date End Date Taking? Authorizing Provider  fluticasone (FLONASE) 50 MCG/ACT nasal spray SPRAY 2 SPRAYS INTO EACH NOSTRIL EVERY DAY Patient taking differently: Place 2 sprays into both nostrils daily.  02/16/18  Yes Nafziger, Tommi Rumps, NP  omeprazole (PRILOSEC) 20 MG capsule  TAKE 1 CAPSULE BY MOUTH EVERY DAY Patient taking differently: Take 20 mg by mouth daily.  06/05/19  Yes Nafziger, Tommi Rumps, NP  diclofenac (VOLTAREN) 75 MG EC tablet Take 1 tablet (75 mg total) by mouth 2 (two) times daily. Patient not taking: Reported on 01/13/2020 11/01/19   Leandrew Koyanagi, MD  EPINEPHrine 0.3 mg/0.3 mL IJ SOAJ injection Inject 0.3 mLs (0.3 mg total) into the muscle as needed for up to 2 doses for anaphylaxis. 01/13/20   Azha Constantin, DO  predniSONE (DELTASONE) 20 MG tablet Take 2 tablets (40 mg total) by mouth daily for 3 days. 01/13/20 01/16/20  Lennice Sites, DO    Allergies    Patient has no known allergies.  Review of Systems   Review of Systems  Constitutional: Negative for chills and fever.  HENT: Negative for ear pain and sore throat.   Eyes: Negative for pain and visual disturbance.  Respiratory: Negative for cough and shortness of breath.   Cardiovascular: Positive for syncope. Negative for chest pain and palpitations.  Gastrointestinal: Positive for abdominal pain (cramping). Negative for vomiting.  Genitourinary: Negative for dysuria and hematuria.  Musculoskeletal: Negative for arthralgias and back pain.  Skin: Positive for rash. Negative for color change.  Neurological: Negative for seizures and syncope.  All other systems reviewed and are negative.   Physical Exam Updated Vital  Signs  ED Triage Vitals  Enc Vitals Group     BP 01/13/20 2001 115/78     Pulse Rate 01/13/20 2001 90     Resp 01/13/20 2001 16     Temp --      Temp src --      SpO2 01/13/20 2001 95 %     Weight --      Height --      Head Circumference --      Peak Flow --      Pain Score 01/13/20 2000 0     Pain Loc --      Pain Edu? --      Excl. in [place]? --     Physical Exam Vitals and nursing note reviewed.  Constitutional:      General: He is not in acute distress.    Appearance: He is well-developed. He is not ill-appearing.  HENT:     Head: Normocephalic and atraumatic.      Nose: Nose normal.     Mouth/Throat:     Mouth: Mucous membranes are moist.     Pharynx: No oropharyngeal exudate or posterior oropharyngeal erythema.     Comments: No lip or tongue swelling Eyes:     Extraocular Movements: Extraocular movements intact.     Conjunctiva/sclera: Conjunctivae normal.     Pupils: Pupils are equal, round, and reactive to light.  Cardiovascular:     Rate and Rhythm: Normal rate and regular rhythm.     Pulses: Normal pulses.     Heart sounds: Normal heart sounds. No murmur heard.   Pulmonary:     Effort: Pulmonary effort is normal. No respiratory distress.     Breath sounds: Normal breath sounds.  Abdominal:     Palpations: Abdomen is soft.     Tenderness: There is no abdominal tenderness.  Musculoskeletal:        General: Normal range of motion.     Cervical back: Normal range of motion and neck supple.  Skin:    General: Skin is warm and dry.     Capillary Refill: Capillary refill takes less than 2 seconds.     Findings: Rash (hives to stomach, chest, legs) present.  Neurological:     General: No focal deficit present.     Mental Status: He is alert.     ED Results / Procedures / Treatments   Labs (all labs ordered are listed, but only abnormal results are displayed) Labs Reviewed  CBC WITH DIFFERENTIAL/PLATELET - Abnormal; Notable for the following components:      Result Value   WBC 15.6 (*)    Neutro Abs 12.8 (*)    All other components within normal limits  BASIC METABOLIC PANEL - Abnormal; Notable for the following components:   Potassium 3.4 (*)    Glucose, Bld 189 (*)    Creatinine, Ser 1.37 (*)    Calcium 8.8 (*)    GFR calc non Af Amer 53 (*)    All other components within normal limits  HEPATIC FUNCTION PANEL  LIPASE, BLOOD    EKG EKG Interpretation  Date/Time:  Monday January 13 2020 20:02:48 EDT Ventricular Rate:  95 PR Interval:  166 QRS Duration: 94 QT Interval:  404 QTC Calculation: 507 R Axis:   -69 Text  Interpretation: Normal sinus rhythm Left axis deviation Pulmonary disease pattern Prolonged QT Abnormal ECG Confirmed by Lennice Sites [phone]) on 01/13/2020 8:11:29 PM   Radiology DG Chest 2 View  Result  Date: 01/13/2020 CLINICAL DATA:  Headache today; multiple syncopal episodes and hypotension today; no cardiopulmonary complaints; EXAM: CHEST - 2 VIEW COMPARISON:  07/30/2005 FINDINGS: Cardiac silhouette is normal in size. No mediastinal or hilar masses. No evidence of adenopathy. Clear lungs.  No pleural effusion or pneumothorax. Advanced arthropathic changes of the left glenohumeral joint. Skeletal structures are grossly intact. IMPRESSION: No active cardiopulmonary disease. Electronically Signed   By: Lajean Manes M.D.   On: 01/13/2020 20:44   CT Head Wo Contrast  Result Date: 01/13/2020 CLINICAL DATA:  Head trauma.  Multiple syncopal episodes. EXAM: CT HEAD WITHOUT CONTRAST CT CERVICAL SPINE WITHOUT CONTRAST TECHNIQUE: Multidetector CT imaging of the head and cervical spine was performed following the standard protocol without intravenous contrast. Multiplanar CT image reconstructions of the cervical spine were also generated. COMPARISON:  Head CT 02/26/2007 and cervical spine CT 07/31/2005 FINDINGS: CT HEAD FINDINGS Brain: There is no evidence of an acute infarct, intracranial hemorrhage, mass, midline shift, or extra-axial fluid collection. A small region of encephalomalacia is again noted in the anteroinferior right frontal lobe consistent with remote trauma. The ventricles are slightly larger than on the prior study suggesting mild interval central predominant brain volume loss. Vascular: Calcified atherosclerosis at the skull base. No hyperdense vessel. Skull: No acute fracture or suspicious osseous lesion. Remote right frontal skull fracture extending into the floor of the anterior cranial fossa which remains partially visible. Sinuses/Orbits: The visualized paranasal sinuses and mastoid air cells  are clear. Unremarkable orbits. Other: None. CT CERVICAL SPINE FINDINGS Alignment: Mild chronic reversal of the normal cervical lordosis. Trace retrolisthesis of C3 on C4. Skull base and vertebrae: No acute fracture or suspicious osseous lesion. Mild median C1-2 arthropathy. Soft tissues and spinal canal: No prevertebral fluid or swelling. No visible canal hematoma. Disc levels: Mild progression of advanced disc degeneration from C3-4 to C6-7 with severe disc space narrowing and prominent degenerative endplate changes at each level. Moderate right-sided neural foraminal stenosis at C3-4 and C4-5 due to uncovertebral spurring. Mild multilevel spinal stenosis. Upper chest: Clear lung apices. Other: Mild calcific atherosclerosis at the left carotid bifurcation. IMPRESSION: 1. No evidence of acute intracranial abnormality. 2. 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 3. No acute cervical spine fracture. 4. Mild progression of advanced cervical disc degeneration. Electronically Signed   By: Logan Bores M.D.   On: 01/13/2020 20:44   CT Cervical Spine Wo Contrast  Result Date: 01/13/2020 CLINICAL DATA:  Head trauma.  Multiple syncopal episodes. EXAM: CT HEAD WITHOUT CONTRAST CT CERVICAL SPINE WITHOUT CONTRAST TECHNIQUE: Multidetector CT imaging of the head and cervical spine was performed following the standard protocol without intravenous contrast. Multiplanar CT image reconstructions of the cervical spine were also generated. COMPARISON:  Head CT 02/26/2007 and cervical spine CT 07/31/2005 FINDINGS: CT HEAD FINDINGS Brain: There is no evidence of an acute infarct, intracranial hemorrhage, mass, midline shift, or extra-axial fluid collection. A small region of encephalomalacia is again noted in the anteroinferior right frontal lobe consistent with remote trauma. The ventricles are slightly larger than on the prior study suggesting mild interval central predominant brain volume loss. Vascular: Calcified  atherosclerosis at the skull base. No hyperdense vessel. Skull: No acute fracture or suspicious osseous lesion. Remote right frontal skull fracture extending into the floor of the anterior cranial fossa which remains partially visible. Sinuses/Orbits: The visualized paranasal sinuses and mastoid air cells are clear. Unremarkable orbits. Other: None. CT CERVICAL SPINE FINDINGS Alignment: Mild chronic reversal of the normal cervical lordosis.  Trace retrolisthesis of C3 on C4. Skull base and vertebrae: No acute fracture or suspicious osseous lesion. Mild median C1-2 arthropathy. Soft tissues and spinal canal: No prevertebral fluid or swelling. No visible canal hematoma. Disc levels: Mild progression of advanced disc degeneration from C3-4 to C6-7 with severe disc space narrowing and prominent degenerative endplate changes at each level. Moderate right-sided neural foraminal stenosis at C3-4 and C4-5 due to uncovertebral spurring. Mild multilevel spinal stenosis. Upper chest: Clear lung apices. Other: Mild calcific atherosclerosis at the left carotid bifurcation. IMPRESSION: 1. No evidence of acute intracranial abnormality. 2. 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 3. No acute cervical spine fracture. 4. Mild progression of advanced cervical disc degeneration. Electronically Signed   By: Logan Bores M.D.   On: 01/13/2020 20:44    Procedures Procedures (including critical care time)  Medications Ordered in ED Medications  famotidine (PEPCID) IVPB 20 mg premix (0 mg Intravenous Stopped 01/13/20 2230)  sodium chloride 0.9 % bolus 1,000 mL (0 mLs Intravenous Stopped 01/13/20 2230)  methylPREDNISolone sodium succinate (SOLU-MEDROL) 125 mg/2 mL injection 125 mg (125 mg Intravenous Given 01/13/20 2119)  sodium chloride 0.9 % bolus 500 mL (500 mLs Intravenous New Bag/Given 01/13/20 2247)    ED Course  I have reviewed the triage vital signs and the nursing notes.  Pertinent labs & imaging results  that were available during my care of the patient were reviewed by me and considered in my medical decision making (see chart for details).    MDM Rules/Calculators/A&P                          Frank Rogers is a 69 year old male with history of autism who presents to the ED after syncopal event.  Upon arrival patient was found to have hives all over, hypotension.  He was treated for anaphylaxis with epinephrine, Benadryl, normal saline and vital signs improved.  Talking to friend who is primary caregiver they had given him a dose of antacid over-the-counter medicine because he was having some abdominal cramps.  Did not notice a rash until after he passed out today.  He did hit his head and lose consciousness.  Overall the patient is awake and alert.  Neurologically intact.  Not complaining of any pain.  Hives have greatly improved per EMS.  He was a little bit hypoxic upon their initial evaluation as well but they did not think he had any wheezing but was difficult to tell.  He is on room air now.  Overall suspect anaphylactic episode likely from medication that was given.  Will give Solu-Medrol, normal saline, famotidine.  Will check basic labs due to unclear story look for any anemia or electrolyte abnormality.  Will get a CT scan of head and neck for traumatic concerns.  11:21 PM was called back to the room as patient stated that he is feeling lightheaded again.  Blood pressure dropped to 86/67 however upon my evaluation blood pressure is normal again and possible error as multiple repeats are normal.  He states he is having some headache.  But does not appear to have any GI symptoms.  Hives are almost completely resolved at this time.  IV fluids have been started now and patient is given IV steroids.  Lab work is pending.  We will continue to monitor.  Overall suspect likely anaphylactic episode today.  Overall lab work is unremarkable.  Mild elevation in creatinine.  Nonspecific leukocytosis.   After  period of observation vital signs have normalized.  Patient with hives that are almost completely gone.  He is ambulatory with good blood pressure.  Will discharge with prescription for epinephrine pen.  Will prescribe steroids.  Primary caregiver given education about this.  Told to no longer use medication that patient was given earlier for stomach cramps which was may be likely the cause of anaphylaxis.  Discharged in good condition.  This chart was dictated using voice recognition software.  Despite best efforts to proofread,  errors can occur which can change the documentation meaning.    Final Clinical Impression(s) / ED Diagnoses Final diagnoses:  Anaphylaxis, initial encounter    Rx / DC Orders ED Discharge Orders         Ordered    EPINEPHrine 0.3 mg/0.3 mL IJ SOAJ injection  As needed        01/13/20 2249    predniSONE (DELTASONE) 20 MG tablet  Daily        01/13/20 2249           Lennice Sites, DO 01/13/20 [place], [place], DO 01/13/20 2321

## 2020-01-13 NOTE — ED Triage Notes (Signed)
Pt BIB EMS from home. EMS was called out for multiple syncopal episodes. Pt was found to be hypotensive, 84 palpated. Also has rash on chest, arms, and back of thighs. EMS gave 0.3 epi, 50 benadryl, and 250 cc NS. Endorses stomach cramps. 88-90% on RA. Now 96% on 2L [place]. Hx of Autism.   20 LAC

## 2020-01-17 ENCOUNTER — Ambulatory Visit: Payer: Medicare HMO | Admitting: Podiatry

## 2020-04-17 ENCOUNTER — Telehealth: Payer: Self-pay | Admitting: Adult Health

## 2020-04-17 NOTE — Telephone Encounter (Signed)
Left message for patient to call back and schedule Medicare Annual Wellness Visit (AWV) either virtually or in office.   Last AWV no information please schedule at anytime with [facility] Nurse Health Advisor 1 or 2   This should be a 45 minute visit. 

## 2020-06-30 ENCOUNTER — Telehealth: Payer: Self-pay | Admitting: Adult Health

## 2020-06-30 NOTE — Telephone Encounter (Signed)
Left message for patient to call back and schedule Medicare Annual Wellness Visit (AWV) either virtually or in office. No detailed message left    Last AWVI please schedule at anytime with [facility] Nurse Health Advisor 1 or 2   This should be a 45 minute visit. 

## 2020-07-31 ENCOUNTER — Ambulatory Visit (INDEPENDENT_AMBULATORY_CARE_PROVIDER_SITE_OTHER): Payer: Medicare HMO | Admitting: Adult Health

## 2020-07-31 ENCOUNTER — Other Ambulatory Visit: Payer: Self-pay

## 2020-07-31 ENCOUNTER — Encounter: Payer: Self-pay | Admitting: Adult Health

## 2020-07-31 VITALS — BP 140/82 | HR 58 | Wt 156.6 lb

## 2020-07-31 DIAGNOSIS — K2100 Gastro-esophageal reflux disease with esophagitis, without bleeding: Secondary | ICD-10-CM | POA: Diagnosis not present

## 2020-07-31 DIAGNOSIS — M109 Gout, unspecified: Secondary | ICD-10-CM

## 2020-07-31 MED ORDER — PREDNISONE 10 MG PO TABS: ORAL_TABLET | ORAL | 0 refills | Status: DC

## 2020-07-31 MED ORDER — DOXYCYCLINE HYCLATE 100 MG PO CAPS: 100.0000 mg | ORAL_CAPSULE | Freq: Two times a day (BID) | ORAL | 0 refills | Status: DC

## 2020-07-31 MED ORDER — OMEPRAZOLE 20 MG PO CPDR: 20.0000 mg | DELAYED_RELEASE_CAPSULE | Freq: Every day | ORAL | 0 refills | Status: DC

## 2020-07-31 NOTE — Patient Instructions (Signed)
It was great seeing you today   I have sent in some prednisone to help with the gout flare but will also cover you with antibiotics for infection   I am going to check some blood work on you today as well   Please schedule a physical exam at the front desk

## 2020-07-31 NOTE — Progress Notes (Signed)
Subjective:    Patient ID: Frank Rogers, male    DOB: 03/26/1951, 70 y.o.   MRN: [id number]  HPI 70 year old male who  has a past medical history of Arthritis, Chicken pox, Migraines, and Seasonal allergies.  He presents to the office today for an acute issue of right foot pain, swelling, redness, and warmth x4 days.  Reports that his symptoms started in his right great toe and then spread from there.  He does have a history of gout.  He denies trauma or injury.  Has not developed any shortness of breath or chest pain.  Diet does consist of beer multiple times a week as well as liver, and various red meats.  Pain is worse with ambulation or when sleeping.  Has not been using any medications over-the-counter  He also needs Prilosec renewed   Review of Systems See HPI   Past Medical History:  Diagnosis Date  . Arthritis   . Chicken pox   . Migraines   . Seasonal allergies     Social History   Socioeconomic History  . Marital status: Single    Spouse name: Not on file  . Number of children: Not on file  . Years of education: Not on file  . Highest education level: Not on file  Occupational History  . Not on file  Tobacco Use  . Smoking status: Never Smoker  . Smokeless tobacco: Never Used  Vaping Use  . Vaping Use: Never used  Substance and Sexual Activity  . Alcohol use: Yes    Alcohol/week: 4.0 standard drinks    Types: 4 Cans of beer per week  . Drug use: No  . Sexual activity: Not on file  Other Topics Concern  . Not on file  Social History Narrative    He is retired    Not married    Social Determinants of Radio broadcast assistant Strain: Not on file  Food Insecurity: Not on file  Transportation Needs: Not on file  Physical Activity: Not on file  Stress: Not on file  Social Connections: Not on file  Intimate Partner Violence: Not on file    No past surgical history on file.  Family History  Problem Relation Age of Onset  . Arthritis Mother    . Hearing loss Mother   . Stroke Mother   . Diabetes Father   . Hearing loss Father   . Heart attack Sister   . Hyperlipidemia Sister   . Hypertension Sister     No Known Allergies  Current Outpatient Medications on File Prior to Visit  Medication Sig Dispense Refill  . diclofenac (VOLTAREN) 75 MG EC tablet Take 1 tablet (75 mg total) by mouth 2 (two) times daily. 30 tablet 2  . EPINEPHrine 0.3 mg/0.3 mL IJ SOAJ injection Inject 0.3 mLs (0.3 mg total) into the muscle as needed for up to 2 doses for anaphylaxis. 2 each 0  . fluticasone (FLONASE) 50 MCG/ACT nasal spray SPRAY 2 SPRAYS INTO EACH NOSTRIL EVERY DAY (Patient taking differently: Place 2 sprays into both nostrils daily.) 48 g 0  . omeprazole (PRILOSEC) 20 MG capsule TAKE 1 CAPSULE BY MOUTH EVERY DAY (Patient taking differently: Take 20 mg by mouth daily.) 90 capsule 1   No current facility-administered medications on file prior to visit.    BP 140/82 (BP Location: Left Arm, Patient Position: Sitting, Cuff Size: Normal)   Pulse (!) 58   Wt 156 lb 9.6 oz (71  kg)   SpO2 100%   BMI 22.47 kg/m       Objective:   Physical Exam Vitals and nursing note reviewed.  Constitutional:      Appearance: Normal appearance.  Cardiovascular:     Rate and Rhythm: Normal rate and regular rhythm.     Pulses: Normal pulses.     Heart sounds: Normal heart sounds.  Pulmonary:     Breath sounds: Normal breath sounds.  Musculoskeletal:        General: Swelling and tenderness present.     Comments: Significant erythema to the right great toe as well as along the lateral aspect of his right foot.  +2 pitting edema throughout dorsum.  Tenderness and warmth felt throughout right foot.  No calf pain, swelling, redness, or tenderness noted  Skin:    General: Skin is warm and dry.     Findings: Erythema present.  Neurological:     General: No focal deficit present.     Mental Status: He is alert and oriented to person, place, and time.   Psychiatric:        Mood and Affect: Mood normal.        Behavior: Behavior normal.        Thought Content: Thought content normal.        Judgment: Judgment normal.       Assessment & Plan:  1. Acute gout of right foot, unspecified cause -Likely acute gout flare but will cover for cellulitis.  Send in prednisone taper as well as doxycycline.  Will check CBC and uric acid levels.  He was advised to follow-up if no improvement over the weekend - Uric Acid; Future - CBC with Differential/Platelet; Future - doxycycline (VIBRAMYCIN) 100 MG capsule; Take 1 capsule (100 mg total) by mouth 2 (two) times daily.  Dispense: 20 capsule; Refill: 0 - predniSONE (DELTASONE) 10 MG tablet; 40 mg x 3 days, 20 mg x 3 days, 10 mg x 3 days  Dispense: 21 tablet; Refill: 0 - CBC with Differential/Platelet - Uric Acid  2. Gastroesophageal reflux disease with esophagitis without hemorrhage  - omeprazole (PRILOSEC) 20 MG capsule; Take 1 capsule (20 mg total) by mouth daily.  Dispense: 90 capsule; Refill: 0   [name], NP

## 2020-08-01 LAB — CBC WITH DIFFERENTIAL/PLATELET
Absolute Monocytes: 603 cells/uL (ref 200–950)
Basophils Absolute: 29 cells/uL (ref 0–200)
Basophils Relative: 0.5 %
Eosinophils Absolute: 139 cells/uL (ref 15–500)
Eosinophils Relative: 2.4 %
HCT: 42.4 % (ref 38.5–50.0)
Hemoglobin: 14.7 g/dL (ref 13.2–17.1)
Lymphs Abs: 1653 cells/uL (ref 850–3900)
MCH: 30.1 pg (ref 27.0–33.0)
MCHC: 34.7 g/dL (ref 32.0–36.0)
MCV: 86.9 fL (ref 80.0–100.0)
MPV: 10.2 fL (ref 7.5–12.5)
Monocytes Relative: 10.4 %
Neutro Abs: 3376 cells/uL (ref 1500–7800)
Neutrophils Relative %: 58.2 %
Platelets: 240 10*3/uL (ref 140–400)
RBC: 4.88 10*6/uL (ref 4.20–5.80)
RDW: 11.8 % (ref 11.0–15.0)
Total Lymphocyte: 28.5 %
WBC: 5.8 10*3/uL (ref 3.8–10.8)

## 2020-08-01 LAB — URIC ACID: Uric Acid, Serum: 6.1 mg/dL (ref 4.0–8.0)

## 2020-08-11 ENCOUNTER — Telehealth: Payer: Self-pay | Admitting: Adult Health

## 2020-08-11 ENCOUNTER — Other Ambulatory Visit: Payer: Self-pay | Admitting: Adult Health

## 2020-08-11 MED ORDER — PREDNISONE 10 MG PO TABS: ORAL_TABLET | ORAL | 0 refills | Status: DC

## 2020-08-11 NOTE — Telephone Encounter (Signed)
Another course of prednisone sent to pharmacy. Please have him refrain from foods/drink that can cause gout.... red meats, beer, liver, white bread

## 2020-08-11 NOTE — Telephone Encounter (Signed)
Spoke with Jenny Reichmann and informed him of the message below.

## 2020-08-11 NOTE — Telephone Encounter (Signed)
Patient's caregiver Jenny Reichmann states the patient has taken all of the medication that [facility] prescribed but now his foot is starting to swell again.  Please advise.

## 2020-08-19 ENCOUNTER — Telehealth: Payer: Self-pay | Admitting: Adult Health

## 2020-08-19 NOTE — Telephone Encounter (Signed)
Pt caretaker call and stated she would like a call back at [phone].

## 2020-08-25 ENCOUNTER — Telehealth: Payer: Self-pay | Admitting: Adult Health

## 2020-08-25 NOTE — Telephone Encounter (Signed)
Frank Rogers would like corey CMA to give her a call at [phone]  No other information given

## 2020-08-25 NOTE — Telephone Encounter (Signed)
Contacted Sherri for more information as PCP is seeing patients at this time.  Sherri declined and requested Tommi Rumps call her.  Message sent to PCP.

## 2020-09-07 ENCOUNTER — Other Ambulatory Visit: Payer: Self-pay

## 2020-09-08 ENCOUNTER — Ambulatory Visit (INDEPENDENT_AMBULATORY_CARE_PROVIDER_SITE_OTHER): Payer: Medicare HMO | Admitting: Adult Health

## 2020-09-08 ENCOUNTER — Encounter: Payer: Self-pay | Admitting: Adult Health

## 2020-09-08 VITALS — BP 100/60 | HR 64 | Temp 97.8°F | Ht 70.0 in | Wt 157.4 lb

## 2020-09-08 DIAGNOSIS — Z23 Encounter for immunization: Secondary | ICD-10-CM

## 2020-09-08 DIAGNOSIS — N400 Enlarged prostate without lower urinary tract symptoms: Secondary | ICD-10-CM

## 2020-09-08 DIAGNOSIS — Z1211 Encounter for screening for malignant neoplasm of colon: Secondary | ICD-10-CM | POA: Diagnosis not present

## 2020-09-08 DIAGNOSIS — Z0000 Encounter for general adult medical examination without abnormal findings: Secondary | ICD-10-CM

## 2020-09-08 LAB — LIPID PANEL
Cholesterol: 177 mg/dL (ref 0–200)
HDL: 29.1 mg/dL — ABNORMAL LOW (ref >39.00)
LDL Cholesterol: 118 mg/dL — ABNORMAL HIGH (ref 0–99)
NonHDL: 148.37
Total CHOL/HDL Ratio: 6
Triglycerides: 150 mg/dL — ABNORMAL HIGH (ref 0.0–149.0)
VLDL: 30 mg/dL (ref 0.0–40.0)

## 2020-09-08 LAB — COMPREHENSIVE METABOLIC PANEL
ALT: 14 U/L (ref 0–53)
AST: 13 U/L (ref 0–37)
Albumin: 4.4 g/dL (ref 3.5–5.2)
Alkaline Phosphatase: 73 U/L (ref 39–117)
BUN: 23 mg/dL (ref 6–23)
CO2: 30 mEq/L (ref 19–32)
Calcium: 9.7 mg/dL (ref 8.4–10.5)
Chloride: 102 mEq/L (ref 96–112)
Creatinine, Ser: 0.97 mg/dL (ref 0.40–1.50)
GFR: 80.69 mL/min (ref >60.00)
Glucose, Bld: 110 mg/dL — ABNORMAL HIGH (ref 70–99)
Potassium: 4.2 mEq/L (ref 3.5–5.1)
Sodium: 139 mEq/L (ref 135–145)
Total Bilirubin: 0.7 mg/dL (ref 0.2–1.2)
Total Protein: 7 g/dL (ref 6.0–8.3)

## 2020-09-08 LAB — CBC WITH DIFFERENTIAL/PLATELET
Basophils Absolute: 0 10*3/uL (ref 0.0–0.1)
Basophils Relative: 0.8 % (ref 0.0–3.0)
Eosinophils Absolute: 0.1 10*3/uL (ref 0.0–0.7)
Eosinophils Relative: 2.4 % (ref 0.0–5.0)
HCT: 41.8 % (ref 39.0–52.0)
Hemoglobin: 14.7 g/dL (ref 13.0–17.0)
Lymphocytes Relative: 35.4 % (ref 12.0–46.0)
Lymphs Abs: 1.6 10*3/uL (ref 0.7–4.0)
MCHC: 35.2 g/dL (ref 30.0–36.0)
MCV: 85.3 fl (ref 78.0–100.0)
Monocytes Absolute: 0.4 10*3/uL (ref 0.1–1.0)
Monocytes Relative: 9.4 % (ref 3.0–12.0)
Neutro Abs: 2.3 10*3/uL (ref 1.4–7.7)
Neutrophils Relative %: 52 % (ref 43.0–77.0)
Platelets: 191 10*3/uL (ref 150.0–400.0)
RBC: 4.9 Mil/uL (ref 4.22–5.81)
RDW: 13.4 % (ref 11.5–15.5)
WBC: 4.4 10*3/uL (ref 4.0–10.5)

## 2020-09-08 LAB — PSA: PSA: 4.85 ng/mL — ABNORMAL HIGH (ref 0.10–4.00)

## 2020-09-08 LAB — TSH: TSH: 1.53 u[IU]/mL (ref 0.35–4.50)

## 2020-09-08 NOTE — Progress Notes (Signed)
Subjective:    Patient ID: Frank Rogers, male    DOB: 1950-06-01, 70 y.o.   MRN: [id number]  HPI Patient presents for yearly preventative medicine examination. He is a pleasant 70 year old male who  has a past medical history of Arthritis, Chicken pox, Migraines, and Seasonal allergies.  History of Gout - had recent flare but has resolved with treatment. He has cut back on foods that cause gout.   All immunizations and health maintenance protocols were reviewed with the patient and needed orders were placed.  Appropriate screening laboratory values were ordered for the patient including screening of hyperlipidemia, renal function and hepatic function. If indicated by BPH, a PSA was ordered.  Medication reconciliation,  past medical history, social history, problem list and allergies were reviewed in detail with the patient  Goals were established with regard to weight loss, exercise, and  diet in compliance with medications Wt Readings from Last 3 Encounters:  09/08/20 157 lb 6.4 oz (71.4 kg)  07/31/20 156 lb 9.6 oz (71 kg)  10/25/19 162 lb (73.5 kg)   He has no acute complaints today   Review of Systems  Constitutional: Negative.   HENT: Negative.   Eyes: Negative.   Respiratory: Negative.   Cardiovascular: Negative.   Gastrointestinal: Negative.   Endocrine: Negative.   Genitourinary: Negative.   Musculoskeletal: Positive for arthralgias.  Skin: Negative.   Allergic/Immunologic: Negative.   Neurological: Negative.   Hematological: Negative.   Psychiatric/Behavioral: Negative.   All other systems reviewed and are negative.    Past Medical History:  Diagnosis Date  . Arthritis   . Chicken pox   . Migraines   . Seasonal allergies     Social History   Socioeconomic History  . Marital status: Single    Spouse name: Not on file  . Number of children: Not on file  . Years of education: Not on file  . Highest education level: Not on file  Occupational  History  . Not on file  Tobacco Use  . Smoking status: Never Smoker  . Smokeless tobacco: Never Used  Vaping Use  . Vaping Use: Never used  Substance and Sexual Activity  . Alcohol use: Yes    Alcohol/week: 4.0 standard drinks    Types: 4 Cans of beer per week  . Drug use: No  . Sexual activity: Not on file  Other Topics Concern  . Not on file  Social History Narrative    He is retired    Not married    Social Determinants of Radio broadcast assistant Strain: Not on file  Food Insecurity: Not on file  Transportation Needs: Not on file  Physical Activity: Not on file  Stress: Not on file  Social Connections: Not on file  Intimate Partner Violence: Not on file    History reviewed. No pertinent surgical history.  Family History  Problem Relation Age of Onset  . Arthritis Mother   . Hearing loss Mother   . Stroke Mother   . Diabetes Father   . Hearing loss Father   . Heart attack Sister   . Hyperlipidemia Sister   . Hypertension Sister     No Known Allergies  Current Outpatient Medications on File Prior to Visit  Medication Sig Dispense Refill  . Aromatic Inhalants (VICKS VAPOINHALER IN) Inhale into the lungs.    Marland Kitchen OVER THE COUNTER MEDICATION OTC medication for infection in the foot (name unknown)     No current facility-administered  medications on file prior to visit.    BP 100/60 (BP Location: Left Arm, Patient Position: Sitting, Cuff Size: Normal)   Pulse 64   Temp 97.8 F (36.6 C) (Oral)   Ht 5\' 10"  (1.778 m)   Wt 157 lb 6.4 oz (71.4 kg)   SpO2 95%   BMI 22.58 kg/m       Objective:   Physical Exam Vitals and nursing note reviewed.  Constitutional:      General: He is not in acute distress.    Appearance: Normal appearance. He is well-developed and normal weight.  HENT:     Head: Normocephalic and atraumatic.     Right Ear: Tympanic membrane, ear canal and external ear normal. There is no impacted cerumen.     Left Ear: Tympanic membrane,  ear canal and external ear normal. There is no impacted cerumen.     Nose: Nose normal. No congestion or rhinorrhea.     Mouth/Throat:     Mouth: Mucous membranes are moist.     Dentition: Abnormal dentition.     Pharynx: Oropharynx is clear. No oropharyngeal exudate or posterior oropharyngeal erythema.  Eyes:     General:        Right eye: No discharge.        Left eye: No discharge.     Extraocular Movements: Extraocular movements intact.     Conjunctiva/sclera: Conjunctivae normal.     Pupils: Pupils are equal, round, and reactive to light.  Neck:     Vascular: No carotid bruit.     Trachea: No tracheal deviation.  Cardiovascular:     Rate and Rhythm: Normal rate and regular rhythm.     Pulses: Normal pulses.     Heart sounds: Normal heart sounds. No murmur heard. No friction rub. No gallop.   Pulmonary:     Effort: Pulmonary effort is normal. No respiratory distress.     Breath sounds: Normal breath sounds. No stridor. No wheezing, rhonchi or rales.  Chest:     Chest wall: No tenderness.  Abdominal:     General: Abdomen is flat. Bowel sounds are normal. There is no distension.     Palpations: Abdomen is soft. There is no mass.     Tenderness: There is no abdominal tenderness. There is no right CVA tenderness, left CVA tenderness, guarding or rebound.     Hernia: No hernia is present.  Musculoskeletal:        General: No swelling, tenderness, deformity or signs of injury. Normal range of motion.     Right lower leg: No edema.     Left lower leg: No edema.  Lymphadenopathy:     Cervical: No cervical adenopathy.  Skin:    General: Skin is warm and dry.     Capillary Refill: Capillary refill takes less than 2 seconds.     Coloration: Skin is not jaundiced or pale.     Findings: No bruising, erythema, lesion or rash.  Neurological:     General: No focal deficit present.     Mental Status: He is alert and oriented to person, place, and time.     Cranial Nerves: No cranial  nerve deficit.     Sensory: No sensory deficit.     Motor: No weakness.     Coordination: Coordination normal.     Gait: Gait normal.     Deep Tendon Reflexes: Reflexes normal.  Psychiatric:        Mood and Affect: Mood normal.  Behavior: Behavior normal.        Thought Content: Thought content normal.        Judgment: Judgment normal.       Assessment & Plan:  1. Routine general medical examination at a health care facility - PPSV 20 given today  - Follow up in one year or sooner if needed - CBC with Differential/Platelet; Future - Comprehensive metabolic panel; Future - Lipid panel; Future - TSH; Future  2. Benign prostatic hyperplasia without lower urinary tract symptoms  - PSA; Future  3. Colon cancer screening  - Ambulatory referral to Gastroenterology   [name], NP

## 2020-09-08 NOTE — Addendum Note (Signed)
Addended by: Agnes Lawrence on: 09/08/2020 10:34 AM   Modules accepted: Orders

## 2020-09-08 NOTE — Patient Instructions (Signed)
It was great seeing you today   We will follow up with you regarding your labs   You received your pneumonia vaccination today   Someone will call John to set up your colonoscopy   I will see you back in one year or sooner if needed

## 2020-09-08 NOTE — Addendum Note (Signed)
Addended by: Janann Colonel on: 09/08/2020 10:36 AM   Modules accepted: Orders

## 2020-09-09 MED ORDER — ATORVASTATIN CALCIUM 10 MG PO TABS: 10.0000 mg | ORAL_TABLET | Freq: Every day | ORAL | 1 refills | Status: DC

## 2020-09-09 NOTE — Addendum Note (Signed)
Addended by: Agnes Lawrence on: 09/09/2020 10:22 AM   Modules accepted: Orders

## 2020-10-07 ENCOUNTER — Telehealth: Payer: Self-pay | Admitting: Adult Health

## 2020-10-07 NOTE — Telephone Encounter (Signed)
Pt has been scheduled for telephone call

## 2020-10-07 NOTE — Telephone Encounter (Signed)
Sherrie caregiver for the pt is calling in to let Tommi Rumps know that the pt is having very bad mood swings and would like to know if Tommi Rumps could give her a call back.  She declined to make an appointment before talking with [facility].  Caregiver would like to have a call back.

## 2020-10-08 ENCOUNTER — Other Ambulatory Visit: Payer: Self-pay

## 2020-10-08 ENCOUNTER — Telehealth (INDEPENDENT_AMBULATORY_CARE_PROVIDER_SITE_OTHER): Payer: Medicare HMO | Admitting: Adult Health

## 2020-10-08 DIAGNOSIS — F39 Unspecified mood [affective] disorder: Secondary | ICD-10-CM

## 2020-10-08 NOTE — Progress Notes (Signed)
Virtual Visit via Video Note  I connected with [name] on 10/08/20 at 11:00 AM EDT by a video enabled telemedicine application and verified that I am speaking with the correct person using two identifiers.  Location patient: home Location provider:work or home office Persons participating in the virtual visit: patient, provider, caregiver Frank Rogers)  I discussed the limitations of evaluation and management by telemedicine and the availability of in person appointments. The patient expressed understanding and agreed to proceed.   HPI: 70 year old male, being evaluated today for mood disorder.  His caregiver Frank Rogers informs me that over the last few months Frank Rogers has had more severe mood swings.  In the past he has had mood disorders but nothing as severe as what is been happening as of lately.  He is getting upset very easily and snaps at family members, and also hitting himself if things go wrong.  He has never been on any medication for mood disorder in the past.  His caregiver denies confusion or signs of a UTI but she is concerned that he may have one   ROS: See pertinent positives and negatives per HPI.  Past Medical History:  Diagnosis Date  . Arthritis   . Chicken pox   . Migraines   . Seasonal allergies     No past surgical history on file.  Family History  Problem Relation Age of Onset  . Arthritis Mother   . Hearing loss Mother   . Stroke Mother   . Diabetes Father   . Hearing loss Father   . Heart attack Sister   . Hyperlipidemia Sister   . Hypertension Sister        Current Outpatient Medications:  .  Aromatic Inhalants (VICKS VAPOINHALER IN), Inhale into the lungs., Disp: , Rfl:  .  atorvastatin (LIPITOR) 10 MG tablet, Take 1 tablet (10 mg total) by mouth daily., Disp: 90 tablet, Rfl: 1 .  OVER THE COUNTER MEDICATION, OTC medication for infection in the foot (name unknown), Disp: , Rfl:   EXAM:  VITALS per patient if applicable:  GENERAL: alert,  oriented, appears well and in no acute distress  HEENT: atraumatic, conjunttiva clear, no obvious abnormalities on inspection of external nose and ears  NECK: normal movements of the head and neck  LUNGS: on inspection no signs of respiratory distress, breathing rate appears normal, no obvious gross SOB, gasping or wheezing  CV: no obvious cyanosis  MS: moves all visible extremities without noticeable abnormality  PSYCH/NEURO: pleasant and cooperative, no obvious depression or anxiety, speech and thought processing grossly intact  ASSESSMENT AND PLAN:  Discussed the following assessment and plan:  1. Mood disorder ([place]) -We will check urine and urine culture to look for UTI.  Had full blood work panel done earlier this month which was unremarkable.  If no urinary tract infection, will likely start the patient on low-dose Wellbutrin. - Culture, Urine; Future - Urinalysis; Future     I discussed the assessment and treatment plan with the patient. The patient was provided an opportunity to ask questions and all were answered. The patient agreed with the plan and demonstrated an understanding of the instructions.   The patient was advised to call back or seek an in-person evaluation if the symptoms worsen or if the condition fails to improve as anticipated.   [name], NP

## 2020-10-12 ENCOUNTER — Other Ambulatory Visit (INDEPENDENT_AMBULATORY_CARE_PROVIDER_SITE_OTHER): Payer: Medicare HMO

## 2020-10-12 ENCOUNTER — Other Ambulatory Visit: Payer: Self-pay

## 2020-10-12 DIAGNOSIS — F39 Unspecified mood [affective] disorder: Secondary | ICD-10-CM | POA: Diagnosis not present

## 2020-10-12 LAB — URINALYSIS, ROUTINE W REFLEX MICROSCOPIC
Bilirubin Urine: NEGATIVE
Ketones, ur: NEGATIVE
Leukocytes,Ua: NEGATIVE
Nitrite: NEGATIVE
Specific Gravity, Urine: >=1.03 — AB (ref 1.000–1.030)
Urine Glucose: NEGATIVE
Urobilinogen, UA: 1 (ref 0.0–1.0)
pH: 5.5 (ref 5.0–8.0)

## 2020-10-13 LAB — URINE CULTURE
MICRO NUMBER:: 11972589
Result:: NO GROWTH
SPECIMEN QUALITY:: ADEQUATE

## 2020-10-14 ENCOUNTER — Other Ambulatory Visit: Payer: Self-pay | Admitting: Adult Health

## 2020-10-14 MED ORDER — BUPROPION HCL ER (SR) 150 MG PO TB12: 150.0000 mg | ORAL_TABLET | Freq: Two times a day (BID) | ORAL | 0 refills | Status: DC

## 2020-10-14 NOTE — Addendum Note (Signed)
Addended by: Gwenyth Ober R on: 10/14/2020 07:48 AM   Modules accepted: Orders

## 2020-10-14 NOTE — Patient Instructions (Signed)
Health Maintenance Due  Topic Date Due  . COVID-19 Vaccine (1) Never done  . COLONOSCOPY (Pts 45-27yrs Insurance coverage will need to be confirmed)  Never done  . Zoster Vaccines- Shingrix (1 of 2) Never done  . PNA vac Low Risk Adult (1 of 2 - PCV13) 01/07/2018    Depression screen PHQ 2/9 09/08/2020 04/14/2017  Decreased Interest 0 0  Down, Depressed, Hopeless 0 0  PHQ - 2 Score 0 0

## 2020-10-14 NOTE — Addendum Note (Signed)
Addended by: Gwenyth Ober R on: 10/14/2020 02:55 PM   Modules accepted: Orders

## 2020-10-14 NOTE — Progress Notes (Signed)
Noted Rx filled

## 2020-10-26 ENCOUNTER — Other Ambulatory Visit: Payer: Self-pay | Admitting: Adult Health

## 2020-10-26 DIAGNOSIS — K2100 Gastro-esophageal reflux disease with esophagitis, without bleeding: Secondary | ICD-10-CM

## 2020-10-27 ENCOUNTER — Telehealth: Payer: Self-pay | Admitting: Adult Health

## 2020-10-27 NOTE — Telephone Encounter (Signed)
Left message for patient to call back and schedule Medicare Annual Wellness Visit (AWV) either virtually or in office.   AWV-I per [place] 07/07/09   please schedule at anytime with [facility] Nurse Health Advisor 1 or 2   This should be a 45 minute visit.

## 2020-11-13 ENCOUNTER — Other Ambulatory Visit: Payer: Self-pay | Admitting: Adult Health

## 2020-11-17 ENCOUNTER — Telehealth (INDEPENDENT_AMBULATORY_CARE_PROVIDER_SITE_OTHER): Payer: Medicare HMO | Admitting: Adult Health

## 2020-11-17 ENCOUNTER — Encounter: Payer: Self-pay | Admitting: Adult Health

## 2020-11-17 DIAGNOSIS — F39 Unspecified mood [affective] disorder: Secondary | ICD-10-CM | POA: Diagnosis not present

## 2020-11-17 DIAGNOSIS — N400 Enlarged prostate without lower urinary tract symptoms: Secondary | ICD-10-CM | POA: Diagnosis not present

## 2020-11-17 MED ORDER — BUPROPION HCL ER (SR) 150 MG PO TB12: 150.0000 mg | ORAL_TABLET | Freq: Two times a day (BID) | ORAL | 1 refills | Status: DC

## 2020-11-17 NOTE — Progress Notes (Signed)
Virtual Visit via Video Note  I connected with [name] on 11/17/20 at 10:30 AM EDT by a video enabled telemedicine application and verified that I am speaking with the correct person using two identifiers.  Location patient: home Location provider:work or home office Persons participating in the virtual visit: patient, provider  I discussed the limitations of evaluation and management by telemedicine and the availability of in person appointments. The patient expressed understanding and agreed to proceed.   HPI: 70 year old male who is being evaluated today for 1 month follow-up regarding mood disorder.  During his last visit his caregiver informed me that over the last few months [name] had had severe mood swings.  He was getting upset very easily and was snapping at family members and also hitting himself when things went wrong.  We checked him for a UTI which was negative and his blood work from earlier in the month was unremarkable.  He was started on Wellbutrin 150 mg twice daily.  Since starting this medication it has made a significant difference in his mood. He is no longer having outbursts and feels as though he is a much happier person. Denies side effects of medication  He has a history of BPH, hematuria, and kidney stones. Was being seen by [facility] but did not like his urologist. He would like to be referred to a new urologist.    ROS: See pertinent positives and negatives per HPI.  Past Medical History:  Diagnosis Date   Arthritis    Chicken pox    Migraines    Seasonal allergies     No past surgical history on file.  Family History  Problem Relation Age of Onset   Arthritis Mother    Hearing loss Mother    Stroke Mother    Diabetes Father    Hearing loss Father    Heart attack Sister    Hyperlipidemia Sister    Hypertension Sister        Current Outpatient Medications:    Aromatic Inhalants (VICKS VAPOINHALER IN), Inhale into the lungs., Disp:  , Rfl:    atorvastatin (LIPITOR) 10 MG tablet, Take 1 tablet (10 mg total) by mouth daily., Disp: 90 tablet, Rfl: 1   buPROPion (WELLBUTRIN SR) 150 MG 12 hr tablet, TAKE 1 TABLET BY MOUTH TWICE A DAY, Disp: 30 tablet, Rfl: 0   OVER THE COUNTER MEDICATION, OTC medication for infection in the foot (name unknown), Disp: , Rfl:   EXAM:  VITALS per patient if applicable:  GENERAL: alert, oriented, appears well and in no acute distress  HEENT: atraumatic, conjunttiva clear, no obvious abnormalities on inspection of external nose and ears  NECK: normal movements of the head and neck  LUNGS: on inspection no signs of respiratory distress, breathing rate appears normal, no obvious gross SOB, gasping or wheezing  CV: no obvious cyanosis  MS: moves all visible extremities without noticeable abnormality  PSYCH/NEURO: pleasant and cooperative, no obvious depression or anxiety, speech and thought processing grossly intact  ASSESSMENT AND PLAN:  Discussed the following assessment and plan: 1. Mood disorder ([place]) - Will keep him on current dose.  - No change in medications  - buPROPion (WELLBUTRIN SR) 150 MG 12 hr tablet; Take 1 tablet (150 mg total) by mouth 2 (two) times daily.  Dispense: 180 tablet; Refill: 1  2. Benign prostatic hyperplasia without lower urinary tract symptoms  - Ambulatory referral to Urology  I discussed the assessment and treatment plan with the patient. The  patient was provided an opportunity to ask questions and all were answered. The patient agreed with the plan and demonstrated an understanding of the instructions.   The patient was advised to call back or seek an in-person evaluation if the symptoms worsen or if the condition fails to improve as anticipated.   [name], NP

## 2020-12-02 ENCOUNTER — Ambulatory Visit (INDEPENDENT_AMBULATORY_CARE_PROVIDER_SITE_OTHER): Payer: Medicare HMO

## 2020-12-02 ENCOUNTER — Other Ambulatory Visit: Payer: Self-pay

## 2020-12-02 DIAGNOSIS — Z1211 Encounter for screening for malignant neoplasm of colon: Secondary | ICD-10-CM

## 2020-12-02 DIAGNOSIS — Z0000 Encounter for general adult medical examination without abnormal findings: Secondary | ICD-10-CM | POA: Diagnosis not present

## 2020-12-02 NOTE — Progress Notes (Addendum)
Virtual Visit via Telephone Note  I connected with  Frank Rogers on 12/02/20 at 10:15 AM EDT by telephone and verified that I am speaking with the correct person using two identifiers.  Medicare Annual Wellness visit completed telephonically due to Covid-19 pandemic.   Persons participating in this call: This Health Coach and this patient.and care giver Lowella Grip   Location: Patient: Home Provider: Office   I discussed the limitations, risks, security and privacy concerns of performing an evaluation and management service by telephone and the availability of in person appointments. The patient expressed understanding and agreed to proceed.  Unable to perform video visit due to video visit attempted and failed and/or patient does not have video capability.   Some vital signs may be absent or patient reported.     Willette Brace, LPN   Subjective:   Frank Rogers is a 70 y.o. male who presents for an Initial Medicare Annual Wellness Visit.  Review of Systems     Cardiac Risk Factors include: advanced age (>35mn, >>8women);male gender     Objective:    There were no vitals filed for this visit. There is no height or weight on file to calculate BMI.  Advanced Directives 12/02/2020  Does Patient Have a Medical Advance Directive? Yes  Type of Advance Directive HPenn Yanin Chart? No - copy requested    Current Medications (verified) Outpatient Encounter Medications as of 12/02/2020  Medication Sig   Aromatic Inhalants (VICKS VAPOINHALER IN) Inhale into the lungs.   atorvastatin (LIPITOR) 10 MG tablet Take 1 tablet (10 mg total) by mouth daily.   buPROPion (WELLBUTRIN SR) 150 MG 12 hr tablet Take 1 tablet (150 mg total) by mouth 2 (two) times daily.   OVER THE COUNTER MEDICATION OTC medication for infection in the foot (name unknown)   No facility-administered encounter medications on file as of  12/02/2020.    Allergies (verified) Patient has no known allergies.   History: Past Medical History:  Diagnosis Date   Arthritis    Chicken pox    Migraines    Seasonal allergies    History reviewed. No pertinent surgical history. Family History  Problem Relation Age of Onset   Arthritis Mother    Hearing loss Mother    Stroke Mother    Diabetes Father    Hearing loss Father    Heart attack Sister    Hyperlipidemia Sister    Hypertension Sister    Social History   Socioeconomic History   Marital status: Single    Spouse name: Not on file   Number of children: Not on file   Years of education: Not on file   Highest education Rogers: Not on file  Occupational History   Not on file  Tobacco Use   Smoking status: Never   Smokeless tobacco: Never  Vaping Use   Vaping Use: Never used  Substance and Sexual Activity   Alcohol use: Not Currently    Alcohol/week: 4.0 standard drinks    Types: 4 Cans of beer per week   Drug use: No   Sexual activity: Not on file  Other Topics Concern   Not on file  Social History Narrative    He is retired    Not married    Social Determinants of HRadio broadcast assistantStrain: Low Risk    Difficulty of Paying Living Expenses: Not hard at all  Food Insecurity:  No Food Insecurity   Worried About Charity fundraiser in the Last Year: Never true   Ran Out of Food in the Last Year: Never true  Transportation Needs: No Transportation Needs   Lack of Transportation (Medical): No   Lack of Transportation (Non-Medical): No  Physical Activity: Inactive   Days of Exercise per Week: 0 days   Minutes of Exercise per Session: 0 min  Stress: No Stress Concern Present   Feeling of Stress : Not at all  Social Connections: Unknown   Frequency of Communication with Friends and Family: Once a week   Frequency of Social Gatherings with Friends and Family: Once a week   Attends Religious Services: Not on Electrical engineer or  Organizations: No   Attends Archivist Meetings: Never   Marital Status: Never married    Tobacco Counseling Counseling given: Not Answered   Clinical Intake:  Pre-visit preparation completed: Yes  Pain : No/denies pain     BMI - recorded: 22.58 Nutritional Status: BMI of 19-24  Normal Diabetes: No  How often do you need to have someone help you when you read instructions, pamphlets, or other written materials from your doctor or pharmacy?: 1 - Never  Diabetic?No  Interpreter Needed?: No  Information entered by :: Charlott Rakes, LPN   Activities of Daily Living In your present state of health, do you have any difficulty performing the following activities: 12/02/2020  Hearing? N  Vision? N  Difficulty concentrating or making decisions? N  Walking or climbing stairs? N  Dressing or bathing? N  Doing errands, shopping? N  Preparing Food and eating ? N  Using the Toilet? N  In the past six months, have you accidently leaked urine? N  Do you have problems with loss of bowel control? N  Managing your Medications? N  Managing your Finances? N  Housekeeping or managing your Housekeeping? N  Some recent data might be hidden    Patient Care Team: [name], NP as PCP - General (Family Medicine) Irine Seal, MD as Attending Physician (Urology)  Indicate any recent Medical Services you may have received from other than [facility] providers in the past year (date may be approximate).     Assessment:   This is a routine wellness examination for Frank Rogers.  Hearing/Vision screen Hearing Screening - Comments:: Pt denies any hearing issues  Vision Screening - Comments:: Pt has not followed up eye dr care giver is working on a eye Dr exams   Dietary issues and exercise activities discussed: Current Exercise Habits: The patient does not participate in regular exercise at present   Goals Addressed             This Visit's Progress    Patient Stated        None at this time       Depression Screen PHQ 2/9 Scores 12/02/2020 09/08/2020 04/14/2017  PHQ - 2 Score 0 0 0    Fall Risk Fall Risk  12/02/2020 09/08/2020 04/14/2017  Falls in the past year? 0 0 No  Number falls in past yr: 0 0 -  Injury with Fall? 0 - -  Follow up Falls prevention discussed - -    FALL RISK PREVENTION PERTAINING TO THE HOME:  Any stairs in or around the home? Yes  If so, are there any without handrails? Yes  Home free of loose throw rugs in walkways, pet beds, electrical cords, etc? Yes  Adequate lighting  in your home to reduce risk of falls? Yes   ASSISTIVE DEVICES UTILIZED TO PREVENT FALLS:  Life alert? No  Use of a cane, walker or w/c? No  Grab bars in the bathroom? Yes  Shower chair or bench in shower? Yes  Elevated toilet seat or a handicapped toilet? No   TIMED UP AND GO:  Was the test performed? No     Cognitive Function:Declined         Immunizations Immunization History  Administered Date(s) Administered   Fluad Quad(high Dose 65+) 03/28/2019   Influenza,inj,Quad PF,6+ Mos 02/03/2017   PNEUMOCOCCAL CONJUGATE-20 09/08/2020      Flu Vaccine status: Due, Education has been provided regarding the importance of this vaccine. Advised may receive this vaccine at local pharmacy or Health Dept. Aware to provide a copy of the vaccination record if obtained from local pharmacy or Health Dept. Verbalized acceptance and understanding.  Pneumococcal vaccine status: Declined,  Education has been provided regarding the importance of this vaccine but patient still declined. Advised may receive this vaccine at local pharmacy or Health Dept. Aware to provide a copy of the vaccination record if obtained from local pharmacy or Health Dept. Verbalized acceptance and understanding.   Covid-19 vaccine status: Declined, Education has been provided regarding the importance of this vaccine but patient still declined. Advised may receive this vaccine at local  pharmacy or Health Dept.or vaccine clinic. Aware to provide a copy of the vaccination record if obtained from local pharmacy or Health Dept. Verbalized acceptance and understanding.  Qualifies for Shingles Vaccine? Yes   Zostavax completed No   Shingrix Completed?: No.    Education has been provided regarding the importance of this vaccine. Patient has been advised to call insurance company to determine out of pocket expense if they have not yet received this vaccine. Advised may also receive vaccine at local pharmacy or Health Dept. Verbalized acceptance and understanding.  Screening Tests Health Maintenance  Topic Date Due   COLONOSCOPY (Pts 45-44yr Insurance coverage will need to be confirmed)  Never done   Zoster Vaccines- Shingrix (1 of 2) Never done   PNA vac Low Risk Adult (1 of 2 - PCV13) 01/07/2018   COVID-19 Vaccine (1) 12/18/2020 (Originally 01/07/1956)   INFLUENZA VACCINE  12/07/2020   Hepatitis C Screening  Completed   HPV VACCINES  Aged Out    Health Maintenance  Health Maintenance Due  Topic Date Due   COLONOSCOPY (Pts 45-439yrInsurance coverage will need to be confirmed)  Never done   Zoster Vaccines- Shingrix (1 of 2) Never done   PNA vac Low Risk Adult (1 of 2 - PCV13) 01/07/2018    Colorectal cancer screening: Referral to GI placed 12/02/20. Pt aware the office will call re: appt.   Additional Screening:  Hepatitis C Screening:  Completed 04/14/17  Vision Screening: Recommended annual ophthalmology exams for early detection of glaucoma and other disorders of the eye. Is the patient up to date with their annual eye exam?  No  Who is the provider or what is the name of the office in which the patient attends annual eye exams? Working on provider  If pt is not established with a provider, would they like to be referred to a provider to establish care? No .   Dental Screening: Recommended annual dental exams for proper oral hygiene  Community Resource Referral /  Chronic Care Management: CRR required this visit?  No   CCM required this visit?  No  Plan:     I have personally reviewed and noted the following in the patient's chart:   Medical and social history Use of alcohol, tobacco or illicit drugs  Current medications and supplements including opioid prescriptions. Patient is not currently taking opioid prescriptions. Functional ability and status Nutritional status Physical activity Advanced directives List of other physicians Hospitalizations, surgeries, and ER visits in previous 12 months Vitals Screenings to include cognitive, depression, and falls Referrals and appointments  In addition, I have reviewed and discussed with patient certain preventive protocols, quality metrics, and best practice recommendations. A written personalized care plan for preventive services as well as general preventive health recommendations were provided to patient.     Willette Brace, LPN   [id number]   Nurse Notes: None

## 2020-12-02 NOTE — Patient Instructions (Signed)
[name] , Thank you for taking time to come for your Medicare Wellness Visit. I appreciate your ongoing commitment to your health goals. Please review the following plan we discussed and let me know if I can assist you in the future.   Screening recommendations/referrals: Colonoscopy: Order placed 12/02/20 Recommended yearly ophthalmology/optometry visit for glaucoma screening and checkup Recommended yearly dental visit for hygiene and checkup  Vaccinations: Influenza vaccine: Declined  Pneumococcal vaccine: Done 09/27/20 Tdap vaccine: Declined Shingles vaccine: Shingrix discussed. Please contact your pharmacy for coverage information.    Covid-19: Declined and discussed  Advanced directives: Please bring a copy of your health care power of attorney and living will to the office at your convenience.  Conditions/risks identified: None at this time  Next appointment: Follow up in one year for your annual wellness visit.   Preventive Care 6 Years and Older, Male Preventive care refers to lifestyle choices and visits with your health care provider that can promote health and wellness. What does preventive care include? A yearly physical exam. This is also called an annual well check. Dental exams once or twice a year. Routine eye exams. Ask your health care provider how often you should have your eyes checked. Personal lifestyle choices, including: Daily care of your teeth and gums. Regular physical activity. Eating a healthy diet. Avoiding tobacco and drug use. Limiting alcohol use. Practicing safe sex. Taking low doses of aspirin every day. Taking vitamin and mineral supplements as recommended by your health care provider. What happens during an annual well check? The services and screenings done by your health care provider during your annual well check will depend on your age, overall health, lifestyle risk factors, and family history of disease. Counseling  Your health care  provider may ask you questions about your: Alcohol use. Tobacco use. Drug use. Emotional well-being. Home and relationship well-being. Sexual activity. Eating habits. History of falls. Memory and ability to understand (cognition). Work and work Statistician. Screening  You may have the following tests or measurements: Height, weight, and BMI. Blood pressure. Lipid and cholesterol levels. These may be checked every 5 years, or more frequently if you are over 79 years old. Skin check. Lung cancer screening. You may have this screening every year starting at age 77 if you have a 30-pack-year history of smoking and currently smoke or have quit within the past 15 years. Fecal occult blood test (FOBT) of the stool. You may have this test every year starting at age 45. Flexible sigmoidoscopy or colonoscopy. You may have a sigmoidoscopy every 5 years or a colonoscopy every 10 years starting at age 27. Prostate cancer screening. Recommendations will vary depending on your family history and other risks. Hepatitis C blood test. Hepatitis B blood test. Sexually transmitted disease (STD) testing. Diabetes screening. This is done by checking your blood sugar (glucose) after you have not eaten for a while (fasting). You may have this done every 1-3 years. Abdominal aortic aneurysm (AAA) screening. You may need this if you are a current or former smoker. Osteoporosis. You may be screened starting at age 81 if you are at high risk. Talk with your health care provider about your test results, treatment options, and if necessary, the need for more tests. Vaccines  Your health care provider may recommend certain vaccines, such as: Influenza vaccine. This is recommended every year. Tetanus, diphtheria, and acellular pertussis (Tdap, Td) vaccine. You may need a Td booster every 10 years. Zoster vaccine. You may need this after  age 46. Pneumococcal 13-valent conjugate (PCV13) vaccine. One dose is  recommended after age 54. Pneumococcal polysaccharide (PPSV23) vaccine. One dose is recommended after age 25. Talk to your health care provider about which screenings and vaccines you need and how often you need them. This information is not intended to replace advice given to you by your health care provider. Make sure you discuss any questions you have with your health care provider. Document Released: 05/22/2015 Document Revised: 01/13/2016 Document Reviewed: 02/24/2015 Elsevier Interactive Patient Education  2017 [employer] Prevention in the Home Falls can cause injuries. They can happen to people of all ages. There are many things you can do to make your home safe and to help prevent falls. What can I do on the outside of my home? Regularly fix the edges of walkways and driveways and fix any cracks. Remove anything that might make you trip as you walk through a door, such as a raised step or threshold. Trim any bushes or trees on the path to your home. Use bright outdoor lighting. Clear any walking paths of anything that might make someone trip, such as rocks or tools. Regularly check to see if handrails are loose or broken. Make sure that both sides of any steps have handrails. Any raised decks and porches should have guardrails on the edges. Have any leaves, snow, or ice cleared regularly. Use sand or salt on walking paths during winter. Clean up any spills in your garage right away. This includes oil or grease spills. What can I do in the bathroom? Use night lights. Install grab bars by the toilet and in the tub and shower. Do not use towel bars as grab bars. Use non-skid mats or decals in the tub or shower. If you need to sit down in the shower, use a plastic, non-slip stool. Keep the floor dry. Clean up any water that spills on the floor as soon as it happens. Remove soap buildup in the tub or shower regularly. Attach bath mats securely with double-sided non-slip rug  tape. Do not have throw rugs and other things on the floor that can make you trip. What can I do in the bedroom? Use night lights. Make sure that you have a light by your bed that is easy to reach. Do not use any sheets or blankets that are too big for your bed. They should not hang down onto the floor. Have a firm chair that has side arms. You can use this for support while you get dressed. Do not have throw rugs and other things on the floor that can make you trip. What can I do in the kitchen? Clean up any spills right away. Avoid walking on wet floors. Keep items that you use a lot in easy-to-reach places. If you need to reach something above you, use a strong step stool that has a grab bar. Keep electrical cords out of the way. Do not use floor polish or wax that makes floors slippery. If you must use wax, use non-skid floor wax. Do not have throw rugs and other things on the floor that can make you trip. What can I do with my stairs? Do not leave any items on the stairs. Make sure that there are handrails on both sides of the stairs and use them. Fix handrails that are broken or loose. Make sure that handrails are as long as the stairways. Check any carpeting to make sure that it is firmly attached to the stairs.  Fix any carpet that is loose or worn. Avoid having throw rugs at the top or bottom of the stairs. If you do have throw rugs, attach them to the floor with carpet tape. Make sure that you have a light switch at the top of the stairs and the bottom of the stairs. If you do not have them, ask someone to add them for you. What else can I do to help prevent falls? Wear shoes that: Do not have high heels. Have rubber bottoms. Are comfortable and fit you well. Are closed at the toe. Do not wear sandals. If you use a stepladder: Make sure that it is fully opened. Do not climb a closed stepladder. Make sure that both sides of the stepladder are locked into place. Ask someone to  hold it for you, if possible. Clearly mark and make sure that you can see: Any grab bars or handrails. First and last steps. Where the edge of each step is. Use tools that help you move around (mobility aids) if they are needed. These include: Canes. Walkers. Scooters. Crutches. Turn on the lights when you go into a dark area. Replace any light bulbs as soon as they burn out. Set up your furniture so you have a clear path. Avoid moving your furniture around. If any of your floors are uneven, fix them. If there are any pets around you, be aware of where they are. Review your medicines with your doctor. Some medicines can make you feel dizzy. This can increase your chance of falling. Ask your doctor what other things that you can do to help prevent falls. This information is not intended to replace advice given to you by your health care provider. Make sure you discuss any questions you have with your health care provider. Document Released: 02/19/2009 Document Revised: 10/01/2015 Document Reviewed: 05/30/2014 Elsevier Interactive Patient Education  2017 [employer].

## 2021-02-24 ENCOUNTER — Telehealth: Payer: Self-pay | Admitting: Adult Health

## 2021-02-24 ENCOUNTER — Other Ambulatory Visit: Payer: Self-pay | Admitting: Adult Health

## 2021-02-24 MED ORDER — PREDNISONE 10 MG PO TABS: ORAL_TABLET | ORAL | 0 refills | Status: DC

## 2021-02-24 MED ORDER — BUPROPION HCL ER (XL) 300 MG PO TB24: 300.0000 mg | ORAL_TABLET | Freq: Every day | ORAL | 1 refills | Status: DC

## 2021-02-24 NOTE — Telephone Encounter (Signed)
Spoke to patient's daughter-in-law who is also his caregiver.  Patient seems to be more moody as of lately, currently on Wellbutrin 150 mg twice daily.  He is also having an acute gout flare.  Increase his Wellbutrin to 300 mg extended release and send in a course of prednisone.  He will follow-up in the office next week so that we can see how he is doing, check a uric acid level and likely started on allopurinol for recurrent gout flares

## 2021-03-15 ENCOUNTER — Telehealth: Payer: Self-pay | Admitting: Adult Health

## 2021-03-15 NOTE — Telephone Encounter (Signed)
Sherrie would like cory to return her call prior to patient appt on 03-17-2021

## 2021-03-16 ENCOUNTER — Other Ambulatory Visit: Payer: Self-pay

## 2021-03-17 ENCOUNTER — Ambulatory Visit (INDEPENDENT_AMBULATORY_CARE_PROVIDER_SITE_OTHER): Payer: Medicare HMO | Admitting: Adult Health

## 2021-03-17 ENCOUNTER — Ambulatory Visit (INDEPENDENT_AMBULATORY_CARE_PROVIDER_SITE_OTHER): Payer: Medicare HMO

## 2021-03-17 VITALS — BP 120/80 | HR 88 | Temp 98.7°F | Ht 70.0 in | Wt 148.0 lb

## 2021-03-17 DIAGNOSIS — M25531 Pain in right wrist: Secondary | ICD-10-CM | POA: Diagnosis not present

## 2021-03-17 DIAGNOSIS — M1A09X0 Idiopathic chronic gout, multiple sites, without tophus (tophi): Secondary | ICD-10-CM | POA: Diagnosis not present

## 2021-03-17 DIAGNOSIS — F39 Unspecified mood [affective] disorder: Secondary | ICD-10-CM | POA: Diagnosis not present

## 2021-03-17 LAB — URIC ACID: Uric Acid, Serum: 7 mg/dL (ref 4.0–7.8)

## 2021-03-17 MED ORDER — OMEPRAZOLE 20 MG PO CPDR: 20.0000 mg | DELAYED_RELEASE_CAPSULE | Freq: Every day | ORAL | 1 refills | Status: DC

## 2021-03-17 MED ORDER — ALLOPURINOL 300 MG PO TABS: 300.0000 mg | ORAL_TABLET | Freq: Every day | ORAL | 6 refills | Status: DC

## 2021-03-17 NOTE — Progress Notes (Signed)
Subjective:    Patient ID: Frank Rogers, male    DOB: 05/03/1951, 70 y.o.   MRN: [id number]  HPI 70 year old male who  has a past medical history of Arthritis, Chicken pox, Migraines, and Seasonal allergies.  He presents to the office today with his son.  I had a telephone call on 02/24/2021 with his daughter-in-law who is also caregiver to the patient.  At this time we increased Wellbutrin to 300 mg due to agitation.  He was also started on prednisone for an acute gout flare.  He was advised to follow-up in the office after his prednisone therapy was concluded so that we could check a uric acid level and start him on allopurinol for chronic gout flares.  Today, his son reports that mood seems to be getting a little bit better but he is not bathing frequently, dressing himself, or doing any of his chores.  He would rather lay in bed all day. Family has to remind him to put on deodorant, bath, and get out of bed, and shower.    Additionally, he was working with his grandson a few weeks ago got tripped up in some yard equipment falling on cement injuring his right wrist, and chest wall.  Reports that the chest wall pain has improved but he continues to have pain in his right wrist with some mild decrease loss of range of motion.   Review of Systems See HPI   Past Medical History:  Diagnosis Date   Arthritis    Chicken pox    Migraines    Seasonal allergies     Social History   Socioeconomic History   Marital status: Single    Spouse name: Not on file   Number of children: Not on file   Years of education: Not on file   Highest education level: Not on file  Occupational History   Not on file  Tobacco Use   Smoking status: Never   Smokeless tobacco: Never  Vaping Use   Vaping Use: Never used  Substance and Sexual Activity   Alcohol use: Not Currently    Alcohol/week: 4.0 standard drinks    Types: 4 Cans of beer per week   Drug use: No   Sexual activity: Not on file   Other Topics Concern   Not on file  Social History Narrative    He is retired    Not married    Social Determinants of Radio broadcast assistant Strain: Low Risk    Difficulty of Paying Living Expenses: Not hard at all  Food Insecurity: No Food Insecurity   Worried About Charity fundraiser in the Last Year: Never true   Arboriculturist in the Last Year: Never true  Transportation Needs: No Transportation Needs   Lack of Transportation (Medical): No   Lack of Transportation (Non-Medical): No  Physical Activity: Inactive   Days of Exercise per Week: 0 days   Minutes of Exercise per Session: 0 min  Stress: No Stress Concern Present   Feeling of Stress : Not at all  Social Connections: Unknown   Frequency of Communication with Friends and Family: Once a week   Frequency of Social Gatherings with Friends and Family: Once a week   Attends Religious Services: Not on Electrical engineer or Organizations: No   Attends Archivist Meetings: Never   Marital Status: Never married  Intimate Partner Violence: Not At Risk  Fear of Current or Ex-Partner: No   Emotionally Abused: No   Physically Abused: No   Sexually Abused: No    No past surgical history on file.  Family History  Problem Relation Age of Onset   Arthritis Mother    Hearing loss Mother    Stroke Mother    Diabetes Father    Hearing loss Father    Heart attack Sister    Hyperlipidemia Sister    Hypertension Sister     No Known Allergies  Current Outpatient Medications on File Prior to Visit  Medication Sig Dispense Refill   Aromatic Inhalants (VICKS VAPOINHALER IN) Inhale into the lungs.     atorvastatin (LIPITOR) 10 MG tablet Take 1 tablet (10 mg total) by mouth daily. 90 tablet 1   buPROPion (WELLBUTRIN XL) 300 MG 24 hr tablet Take 1 tablet (300 mg total) by mouth daily. 90 tablet 1   OVER THE COUNTER MEDICATION OTC medication for infection in the foot (name unknown)     No current  facility-administered medications on file prior to visit.    BP 120/80   Pulse 88   Temp 98.7 F (37.1 C) (Oral)   Ht 5\' 10"  (1.778 m)   Wt 148 lb (67.1 kg)   SpO2 96%   BMI 21.24 kg/m       Objective:   Physical Exam Vitals and nursing note reviewed.  Constitutional:      Appearance: Normal appearance.  Cardiovascular:     Rate and Rhythm: Normal rate and regular rhythm.     Pulses: Normal pulses.     Heart sounds: Normal heart sounds.  Pulmonary:     Effort: Pulmonary effort is normal.     Breath sounds: Normal breath sounds.  Musculoskeletal:     Right wrist: Swelling (mild swelling along dosral aspect) and bony tenderness (Pisiform, Lunate and Scaphoid) present. No snuff box tenderness or crepitus. Decreased range of motion. Normal pulse.  Skin:    General: Skin is warm and dry.     Capillary Refill: Capillary refill takes less than 2 seconds.  Neurological:     General: No focal deficit present.     Mental Status: He is alert and oriented to person, place, and time.  Psychiatric:        Mood and Affect: Mood normal.        Behavior: Behavior normal.        Thought Content: Thought content normal.        Judgment: Judgment normal.      Assessment & Plan:  1. Chronic gout of multiple sites, unspecified cause  - Uric Acid; Future - allopurinol (ZYLOPRIM) 300 MG tablet; Take 1 tablet (300 mg total) by mouth daily.  Dispense: 30 tablet; Refill: 6 - Uric Acid  2. Right wrist pain  - DG Wrist Complete Right; Future  3. Mood disorder ([place]) - Encouraged patient to get up out of bed at the same time every morning. Needs to change his clothes and shower daily. Family will work on reminding him daily. Continue with Wellbutrin.   [name], NP

## 2021-03-17 NOTE — Patient Instructions (Signed)
Frank Rogers,   It was great seeing you today   I am going to xray the right wrist to make sure you did not break anything   I am also going to check your uric acid level and send in a prescription for Allopurinol which will help prevent gout flares  Use Claritin every day for allergy symptoms   Please start walking daily and showering daily

## 2021-03-18 NOTE — Telephone Encounter (Signed)
This was taking care of. °

## 2021-03-19 ENCOUNTER — Encounter: Payer: Self-pay | Admitting: Adult Health

## 2021-03-26 ENCOUNTER — Other Ambulatory Visit: Payer: Self-pay | Admitting: Adult Health

## 2021-03-26 DIAGNOSIS — F39 Unspecified mood [affective] disorder: Secondary | ICD-10-CM

## 2021-03-29 ENCOUNTER — Telehealth: Payer: Self-pay | Admitting: *Deleted

## 2021-03-29 NOTE — Chronic Care Management (AMB) (Signed)
  Chronic Care Management   Note  03/29/2021 Name: Frank Rogers MRN: [id number] DOB: Nov 25, 1950  [name] is a 70 y.o. year old male who is a primary care patient of [name], NP. I reached out to [name] by phone today in response to a referral sent by [name]'s PCP.  [name] was given information about Chronic Care Management services today including:  CCM service includes personalized support from designated clinical staff supervised by his physician, including individualized plan of care and coordination with other care providers 24/7 contact phone numbers for assistance for urgent and routine care needs. Service will only be billed when office clinical staff spend 20 minutes or more in a month to coordinate care. Only one practitioner may furnish and bill the service in a calendar month. The patient may stop CCM services at any time (effective at the end of the month) by phone call to the office staff. The patient is responsible for co-pay (up to 20% after annual deductible is met) if co-pay is required by the individual health plan.   Confirmed by [facility] DPR on file verbally agreed to assistance and services provided by embedded care coordination/care management team today.  Follow up plan: Telephone appointment with care management team member scheduled for:03/31/21  [place] Management  Direct Dial: [phone]

## 2021-03-31 ENCOUNTER — Telehealth: Payer: Medicare HMO | Admitting: *Deleted

## 2021-04-02 ENCOUNTER — Telehealth: Payer: Self-pay | Admitting: *Deleted

## 2021-04-02 NOTE — Telephone Encounter (Signed)
  Care Management   Follow Up Note   04/02/2021 Name: Frank Rogers MRN: [id number] DOB: 05-24-1950   Referred by: [name], NP  Reason for referral : Chronic Care Management in Patient with Mood Disorder and Benign Prostatic Hyperplasia.   An unsuccessful telephone outreach was attempted today. The patient was referred to the case management team for assistance with care management and care coordination. HIPAA complaint messages were left on voicemail for patient, as well as patient's caregiver, Rana Snare, explaining the reason for the call, providing contact information, and encouraging them to return LCSW's call at their earliest convenience.  LCSW will make a second initial telephone outreach call within the next 5-7 business days, if a return call is not received in the meantime.  Follow-Up Plan:  A request was placed to the Scheduling Care Guides to reschedule patient's initial telephonic outreach call with LCSW.  Nat Christen LCSW Licensed Clinical Social Worker [place] [phone]

## 2021-04-15 ENCOUNTER — Ambulatory Visit: Payer: Medicare HMO | Admitting: *Deleted

## 2021-04-15 DIAGNOSIS — N400 Enlarged prostate without lower urinary tract symptoms: Secondary | ICD-10-CM

## 2021-04-15 DIAGNOSIS — M25531 Pain in right wrist: Secondary | ICD-10-CM

## 2021-04-15 DIAGNOSIS — M25521 Pain in right elbow: Secondary | ICD-10-CM

## 2021-04-15 DIAGNOSIS — K2100 Gastro-esophageal reflux disease with esophagitis, without bleeding: Secondary | ICD-10-CM

## 2021-04-15 DIAGNOSIS — M109 Gout, unspecified: Secondary | ICD-10-CM

## 2021-04-15 DIAGNOSIS — M1A09X0 Idiopathic chronic gout, multiple sites, without tophus (tophi): Secondary | ICD-10-CM

## 2021-04-15 DIAGNOSIS — F39 Unspecified mood [affective] disorder: Secondary | ICD-10-CM

## 2021-04-15 DIAGNOSIS — Z23 Encounter for immunization: Secondary | ICD-10-CM

## 2021-04-19 ENCOUNTER — Ambulatory Visit: Payer: Medicare HMO | Admitting: Podiatry

## 2021-04-19 ENCOUNTER — Other Ambulatory Visit: Payer: Self-pay

## 2021-04-19 ENCOUNTER — Encounter: Payer: Self-pay | Admitting: Podiatry

## 2021-04-19 DIAGNOSIS — M79675 Pain in left toe(s): Secondary | ICD-10-CM | POA: Diagnosis not present

## 2021-04-19 DIAGNOSIS — M79674 Pain in right toe(s): Secondary | ICD-10-CM | POA: Diagnosis not present

## 2021-04-19 DIAGNOSIS — B351 Tinea unguium: Secondary | ICD-10-CM

## 2021-04-19 NOTE — Patient Instructions (Signed)
Visit Information   Thank you for taking time to visit with me today. Please don't hesitate to contact me if I can be of assistance to you before our next scheduled telephone appointment.  Following are the goals we discussed today:      Patient Goals/Self-Care Activities: Begin personal counseling with LCSW, on a bi-weekly basis, to reduce and manage symptoms of Mood Disorder, until well-managed.   Incorporate into daily practice - relaxation techniques, deep breathing exercises and mindfulness meditation strategies. LCSW collaboration with Nurse Practitioner, [name], as well as primary caregivers, Jenny Reichmann and Lowella Grip to develop most appropriate behavior modification plan for you. Contact LCSW directly (# [id number]) if you have questions, need assistance, or if additional social work needs are identified between now and our next scheduled telephone outreach call.   Our next appointment is by telephone on 04/22/2021 at 2:45 pm.  Please call the care guide team at [phone] if you need to cancel or reschedule your appointment.   If you are experiencing a Mental Health or [place] or need someone to talk to, please call the Suicide and Crisis Lifeline: 988 call the [place] National Suicide Prevention Lifeline: 2020827240 or TTY: (979)408-6694 TTY 774-063-5907) to talk to a trained counselor call 1-800-273-TALK (toll free, 24 hour hotline) go to [facility] [address] [place] [phone]) call the [facility]: [phone] call 911   Following is a copy of your full care plan:  Care Plan : LCSW Plan of Care  Updates made by Francis Gaines, LCSW since 04/19/2021 12:00 AM     Problem: Enhance My Mental and Physical Skills.   Priority: High     Long-Range Goal: Enhance My Mental and Physical Skills.   Start Date: 04/15/2021  Expected End Date: 09/13/2021  This Visit's Progress: On track   Priority: High  Note:   Current Barriers:   Acute Mental Health needs related to Mood Disorder, Caregiver Stress, and Autism requires Support, Education, Resources, Referrals, Advocacy, and Care Coordination in order to meet unmet mental health needs. Clinical Goal(s):  Patient will work with LCSW to develop a behavior modification plan, to reduce and manage symptoms of mood, isolation, and lack of motivation, and will consistently perform daily chores, without constant reminders and complaints, as well as implement a daily hygiene regimen. Patient will increase knowledge and/or ability of:        Coping Skills, Healthy Habits, Self-Management Skills, Stress Reduction, Home Safety and Utilizing [employer]s and Resources.   Clinical Interventions:  Assessed patient's previous treatment, needs, coping skills, current treatment, support system and barriers to care. PHQ-2 and PHQ-9 Depression Screening Tool performed and results reviewed with patient.   Assessment of homicidal and/or suicidal ideations - None present. Mindfulness Meditation Strategies, Relaxation Techniques and Deep Breathing Exercises taught and encouraged, daily. Solution-Focused Therapy performed, Verbalization of Feelings encouraged, Emotional Support provided, Problem Solving Solutions developed, Brief Cognitive Behavioral Therapy initiated, Quality of Sleep assessed and Sleep Hygiene Techniques promoted, Increase Level of Activity/Exercise emphasized.    Discussed plans with patient for ongoing care management follow-up and provided patient with direct contact information for care management team. Discussed several options for long-term counseling based on need and insurance, but patient denied, only agreeable to working with LCSW.     Collaboration with Nurse Practitioner, [name] regarding development and update of comprehensive plan of care as evidenced by provider attestation and co-signature. Collaboration with  Nurse Practitioner, [name] to  report that increase in psychotropic medication (Wellbutrin XL 300 MG, PO, Daily) has been ineffective, per caregiver report. Inter-disciplinary care team collaboration (see longitudinal plan of care). Patient Goals/Self-Care Activities: Begin personal counseling with LCSW, on a bi-weekly basis, to reduce and manage symptoms of Mood Disorder, until well-managed.   Incorporate into daily practice - relaxation techniques, deep breathing exercises and mindfulness meditation strategies. LCSW collaboration with Nurse Practitioner, [name], as well as primary caregivers, Jenny Reichmann and Lowella Grip to develop most appropriate behavior modification plan for you. Contact LCSW directly (# [id number]) if you have questions, need assistance, or if additional social work needs are identified between now and our next scheduled telephone outreach call.  Follow-Up:  04/22/2021 at 2:45 pm     Consent to CCM Services: Frank Rogers was given information about Chronic Care Management services including:  CCM service includes personalized support from designated clinical staff supervised by his physician, including individualized plan of care and coordination with other care providers 24/7 contact phone numbers for assistance for urgent and routine care needs. Service will only be billed when office clinical staff spend 20 minutes or more in a month to coordinate care. Only one practitioner may furnish and bill the service in a calendar month. The patient may stop CCM services at any time (effective at the end of the month) by phone call to the office staff. The patient will be responsible for cost sharing (co-pay) of up to 20% of the service fee (after annual deductible is met).  Patient agreed to services and verbal consent obtained.   Patient verbalizes understanding of instructions provided today and agrees to view in [place].   Telephone follow up appointment with  care management team member scheduled for:  04/22/2021 at 2:45 pm.  Nat Christen LCSW Licensed Clinical Social Worker [place] [phone]

## 2021-04-19 NOTE — Progress Notes (Signed)
This patient returns to the office for evaluation and treatment of long thick painful nails .  This patient is unable to trim his own nails since the patient cannot reach his feet.  Patient says the nails are painful walking and wearing his shoes.  He returns for preventive foot care services.  General Appearance  Alert, conversant and in no acute stress.  Vascular  Dorsalis pedis and posterior tibial  pulses are palpable  bilaterally.  Capillary return is within normal limits  bilaterally. Temperature is within normal limits  bilaterally.  Neurologic  Senn-Weinstein monofilament wire test within normal limits  bilaterally. Muscle power within normal limits bilaterally.  Nails Thick disfigured discolored nails with subungual debris  from hallux to fifth toes bilaterally. No evidence of bacterial infection or drainage bilaterally.  Orthopedic  No limitations of motion  feet .  No crepitus or effusions noted.  No bony pathology or digital deformities noted.  HAV  B/L.    Skin  normotropic skin with no porokeratosis noted bilaterally.  No signs of infections or ulcers noted.     Onychomycosis  Pain in toes right foot  Pain in toes left foot  Debridement  of nails  1-5  B/L with a nail nipper.  Nails were then filed using a dremel tool with no incidents. RTC 3 months.   Christie Viscomi DPM   

## 2021-04-19 NOTE — Chronic Care Management (AMB) (Signed)
Chronic Care Management    Clinical Social Work Note  04/19/2021 Name: Frank Rogers MRN: [id number] DOB: October 11, 1950  Frank Rogers is a 70 y.o. year old male who is a primary care patient of [name], NP. The CCM team was consulted to assist the patient with chronic disease management and/or care coordination needs related to: Mental Health Counseling and Resources and Caregiver Stress.   Engaged with patient and primary caregiver by telephone for initial visit in response to provider referral for social work chronic care management and care coordination services.   Consent to Services:  The patient was given information about Chronic Care Management services, agreed to services, and gave verbal consent prior to initiation of services.  Please see initial visit note for detailed documentation.   Patient agreed to services and consent obtained.   Assessment: Review of patient past medical history, allergies, medications, and health status, including review of relevant consultants reports was performed today as part of a comprehensive evaluation and provision of chronic care management and care coordination services.     SDOH (Social Determinants of Health) assessments and interventions performed:  SDOH Interventions    Flowsheet Row Most Recent Value  SDOH Interventions   Food Insecurity Interventions Intervention Not Indicated, Other (Comment)  [Verified by Caregiver - Sherrie Coltrane]  Financial Strain Interventions Intervention Not Indicated, Other (Comment)  [Verified by Caregiver - [place] Interventions Intervention Not Indicated, Other (Comment)  [Verified by Caregiver - [place]  Intimate Partner Violence Interventions Intervention Not Indicated, Other (Comment)  [Verified by Caregiver - Sherrie Coltrane]  Physical Activity Interventions Patient Refused, Other (Comments)  [Verified by Caregiver - Sherrie Coltrane]  Stress Interventions  Intervention Not Indicated, Other (Comment)  [Verified by Caregiver - Sherrie Coltrane]  Social Connections Interventions Intervention Not Indicated, Other (Comment)  [Verified by Caregiver - Sherrie Coltrane]  Transportation Interventions Intervention Not Indicated, Other (Comment)  [Verified by Caregiver - Sherrie Coltrane]        Advanced Directives Status: See Care Plan for related entries.  CCM Care Plan  No Known Allergies  Outpatient Encounter Medications as of 04/15/2021  Medication Sig   allopurinol (ZYLOPRIM) 300 MG tablet Take 1 tablet (300 mg total) by mouth daily.   Aromatic Inhalants (VICKS VAPOINHALER IN) Inhale into the lungs.   atorvastatin (LIPITOR) 10 MG tablet Take 1 tablet (10 mg total) by mouth daily.   buPROPion (WELLBUTRIN XL) 300 MG 24 hr tablet Take 1 tablet (300 mg total) by mouth daily.   omeprazole (PRILOSEC) 20 MG capsule Take 1 capsule (20 mg total) by mouth daily.   OVER THE COUNTER MEDICATION OTC medication for infection in the foot (name unknown)   No facility-administered encounter medications on file as of 04/15/2021.    Patient Active Problem List   Diagnosis Date Noted   BPH (benign prostatic hyperplasia) 04/14/2017    Conditions to be addressed/monitored: Mood Disorder, Caregiver Stress and Autism.  Limited Social Support, Mental Health Concerns, Family and Relationship Dysfunction, Social Isolation, Literacy Concerns, Cognitive Deficits, Memory Deficits, and Lacks Knowledge of [employer].  Care Plan : LCSW Plan of Care  Updates made by Francis Gaines, LCSW since 04/19/2021 12:00 AM     Problem: Enhance My Mental and Physical Skills.   Priority: High     Long-Range Goal: Enhance My Mental and Physical Skills.   Start Date: 04/15/2021  Expected End Date: 09/13/2021  This Visit's Progress: On track  Priority: High  Note:  Current Barriers:   Acute Mental Health needs related to Mood Disorder, Caregiver Stress, and Autism  requires Support, Education, Resources, Referrals, Advocacy, and Care Coordination in order to meet unmet mental health needs. Clinical Goal(s):  Patient will work with LCSW to develop a behavior modification plan, to reduce and manage symptoms of mood, isolation, and lack of motivation, and will consistently perform daily chores, without constant reminders and complaints, as well as implement a daily hygiene regimen. Patient will increase knowledge and/or ability of:        Coping Skills, Healthy Habits, Self-Management Skills, Stress Reduction, Home Safety and Utilizing [employer] and Resources.   Clinical Interventions:  Assessed patient's previous treatment, needs, coping skills, current treatment, support system and barriers to care. PHQ-2 and PHQ-9 Depression Screening Tool performed and results reviewed with patient.   Assessment of homicidal and/or suicidal ideations - None present. Mindfulness Meditation Strategies, Relaxation Techniques and Deep Breathing Exercises taught and encouraged, daily. Solution-Focused Therapy performed, Verbalization of Feelings encouraged, Emotional Support provided, Problem Solving Solutions developed, Brief Cognitive Behavioral Therapy initiated, Quality of Sleep assessed and Sleep Hygiene Techniques promoted, Increase Level of Activity/Exercise emphasized.    Discussed plans with patient for ongoing care management follow-up and provided patient with direct contact information for care management team. Discussed several options for long-term counseling based on need and insurance, but patient denied, only agreeable to working with LCSW.     Collaboration with Nurse Practitioner, [name] regarding development and update of comprehensive plan of care as evidenced by provider attestation and co-signature. Collaboration with Nurse Practitioner, [name] to report that increase in psychotropic medication (Wellbutrin XL 300 MG, PO, Daily) has been  ineffective, per caregiver report. Inter-disciplinary care team collaboration (see longitudinal plan of care). Patient Goals/Self-Care Activities: Begin personal counseling with LCSW, on a bi-weekly basis, to reduce and manage symptoms of Mood Disorder, until well-managed.   Incorporate into daily practice - relaxation techniques, deep breathing exercises and mindfulness meditation strategies. LCSW collaboration with Nurse Practitioner, [name], as well as primary caregivers, Jenny Reichmann and Lowella Grip to develop most appropriate behavior modification plan for you. Contact LCSW directly (# [id number]) if you have questions, need assistance, or if additional social work needs are identified between now and our next scheduled telephone outreach call.  Follow-Up:  04/22/2021 at 2:45 pm   Nat Christen LCSW Licensed Clinical Social Worker [place] [phone]

## 2021-04-22 ENCOUNTER — Ambulatory Visit (INDEPENDENT_AMBULATORY_CARE_PROVIDER_SITE_OTHER): Payer: Medicare HMO | Admitting: *Deleted

## 2021-04-22 DIAGNOSIS — N400 Enlarged prostate without lower urinary tract symptoms: Secondary | ICD-10-CM

## 2021-04-22 DIAGNOSIS — M109 Gout, unspecified: Secondary | ICD-10-CM

## 2021-04-22 DIAGNOSIS — K2100 Gastro-esophageal reflux disease with esophagitis, without bleeding: Secondary | ICD-10-CM

## 2021-04-22 DIAGNOSIS — Z0000 Encounter for general adult medical examination without abnormal findings: Secondary | ICD-10-CM

## 2021-04-22 DIAGNOSIS — F39 Unspecified mood [affective] disorder: Secondary | ICD-10-CM

## 2021-04-22 DIAGNOSIS — Z1211 Encounter for screening for malignant neoplasm of colon: Secondary | ICD-10-CM

## 2021-04-22 DIAGNOSIS — M1A09X0 Idiopathic chronic gout, multiple sites, without tophus (tophi): Secondary | ICD-10-CM

## 2021-04-22 DIAGNOSIS — Z23 Encounter for immunization: Secondary | ICD-10-CM

## 2021-04-22 NOTE — Patient Instructions (Signed)
Visit Information  Thank you for taking time to visit with me today. Please don't hesitate to contact me if I can be of assistance to you before our next scheduled telephone appointment.  Following are the goals we discussed today:   Patient Goals/Self-Care Activities: Continue to receive personal counseling with LCSW, on a bi-weekly basis, to reduce and manage symptoms of Mood Disorder, until well-managed.   LCSW collaboration with Nurse Practitioner, [name], to report Wellbutrin XL 300 MG, PO, Daily, and Claritin, have proven to be ineffective, according to primary caregivers, Jenny Reichmann and [employer].  Primary caregivers, Jenny Reichmann and [employer] have been provided the following information for educational purposes, and will gradually begin to implement: ~  Sequencing can be difficult for an autistic person - that is, putting what is going to happen in a day in a logical order in their mind.  Abstract concepts such as time are not easy to understand, and autistic people may find it hard to wait.  You may find their behavior changes when transitioning between activities, for example, becoming anxious or appearing defiant.  Unstructured time is often difficult and chaotic for an autistic person.    ~  Behavior Modification Suggestions:   1.  Use a visual timetable showing the days activities, or a now-and-next board. 2.  Use a timer to indicate when an activity is finished. 3.  Encourage the person to put the activity into a finished tray, or put the symbol for the activity into a finished box, to signal that the activity is over. 4.  Use visual supports to show the steps leading up to each activity. 5.  Make the waiting time between activities as short as possible. 6.  Have a visual, concrete representation of how long the person needs to wait before the activity is going to begin - electronic timer. 7.  Consider making enjoyable activities available during transition times - a  transition box, containing a number of different activities, could keep the person focused during these times, making an unstructured timeframe much more structured.  Contact LCSW directly (# [id number]) if you have questions, need assistance, or if additional social work needs are identified between now and our next scheduled telephone outreach call.   Follow-Up:  05/24/2021 at 2:15 pm  Please call the care guide team at [phone] if you need to cancel or reschedule your appointment.   If you are experiencing a Mental Health or [place] or need someone to talk to, please call the Suicide and Crisis Lifeline: 988 call the [place] National Suicide Prevention Lifeline: 330-169-4125 or TTY: 770-028-6963 TTY 574-318-6706) to talk to a trained counselor call 1-800-273-TALK (toll free, 24 hour hotline) go to [facility] [address], [place] [id number]) call the [place]: [phone] call 911   Patient verbalizes understanding of instructions provided today and agrees to view in [place].   Nat Christen LCSW Licensed Clinical Social Worker [place] [phone]

## 2021-04-22 NOTE — Chronic Care Management (AMB) (Signed)
Chronic Care Management    Clinical Social Work Note  04/22/2021 Name: Frank Rogers MRN: [id number] DOB: [birth year]  Frank Rogers is a 70 y.o. year old male who is a primary care patient of [name], NP. The CCM team was consulted to assist the patient with chronic disease management and/or care coordination needs related to: [employer], Mental Health Counseling and Resources, and Caregiver Stress.   Engaged with patient's primary caregiver by telephone for follow up visit in response to provider referral for social work chronic care management and care coordination services.   Consent to Services:  The patient was given information about Chronic Care Management services, agreed to services, and gave verbal consent prior to initiation of services.  Please see initial visit note for detailed documentation.   Patient agreed to services and consent obtained.   Assessment: Review of patient past medical history, allergies, medications, and health status, including review of relevant consultants reports was performed today as part of a comprehensive evaluation and provision of chronic care management and care coordination services.     SDOH (Social Determinants of Health) assessments and interventions performed:    Advanced Directives Status: Not addressed in this encounter.  CCM Care Plan  No Known Allergies  Outpatient Encounter Medications as of 04/22/2021  Medication Sig   allopurinol (ZYLOPRIM) 300 MG tablet Take 1 tablet (300 mg total) by mouth daily.   Aromatic Inhalants (VICKS VAPOINHALER IN) Inhale into the lungs.   atorvastatin (LIPITOR) 10 MG tablet Take 1 tablet (10 mg total) by mouth daily.   buPROPion (WELLBUTRIN XL) 300 MG 24 hr tablet Take 1 tablet (300 mg total) by mouth daily.   omeprazole (PRILOSEC) 20 MG capsule Take 1 capsule (20 mg total) by mouth daily.   OVER THE COUNTER MEDICATION OTC medication for infection in the foot (name unknown)    No facility-administered encounter medications on file as of 04/22/2021.    Patient Active Problem List   Diagnosis Date Noted   BPH (benign prostatic hyperplasia) 04/14/2017    Conditions to be addressed/monitored: Mood Disorder and Autism.  Mental Health Concerns, Social Isolation, Literacy Concerns, Limited Education, Cognitive Deficits, Memory Deficits, and Lacks Knowledge of [employer].  Care Plan : LCSW Plan of Care  Updates made by Francis Gaines, LCSW since 04/22/2021 12:00 AM     Problem: Enhance My Mental and Physical Skills.   Priority: High     Long-Range Goal: Enhance My Mental and Physical Skills.   Start Date: 04/15/2021  Expected End Date: 09/13/2021  This Visit's Progress: On track  Recent Progress: On track  Priority: High  Note:   Current Barriers:   Acute Mental Health needs related to Mood Disorder, Caregiver Stress, and Autism requires Support, Education, Resources, Referrals, Advocacy, and Care Coordination in order to meet unmet mental health needs. Clinical Goal(s):  Patient will work with LCSW to develop a behavior modification plan, to reduce and manage symptoms of mood, isolation, and lack of motivation, and will consistently perform daily chores, without constant reminders and complaints, as well as implement a daily hygiene regimen. Patient will increase knowledge and/or ability of:  Coping Skills, Healthy Habits, Self-Management Skills, Stress Reduction, Home Safety and Utilizing [employer] and Resources.   Clinical Interventions:  Solution-Focused Therapy performed, Verbalization of Feelings encouraged, Emotional Support provided, Problem-Solving Solutions developed, Cognitive Behavioral Therapy initiated, Behavior Modification Plan reviewed and implemented, Increase Level of Activity/Exercise emphasized.     Collaboration with Nurse Practitioner, [name]  regarding development and update of comprehensive plan of care as  evidenced by provider attestation and co-signature. Inter-disciplinary care team collaboration (see longitudinal plan of care). Patient Goals/Self-Care Activities: Continue to receive personal counseling with LCSW, on a bi-weekly basis, to reduce and manage symptoms of Mood Disorder, until well-managed.   LCSW collaboration with Nurse Practitioner, [name], to report Wellbutrin XL 300 MG, PO, Daily, and Claritin, have proven to be ineffective, according to primary caregivers, Jenny Reichmann and [employer].  Primary caregivers, Jenny Reichmann and [employer] have been provided the following information for educational purposes, and will gradually begin to implement: ~  Sequencing can be difficult for an autistic person - that is, putting what is going to happen in a day in a logical order in their mind.  Abstract concepts such as time are not easy to understand, and autistic people may find it hard to wait.  You may find their behavior changes when transitioning between activities, for example, becoming anxious or appearing defiant.  Unstructured time is often difficult and chaotic for an autistic person.    ~  Behavior Modification Suggestions:   1.  Use a visual timetable showing the days activities, or a now-and-next board. 2.  Use a timer to indicate when an activity is finished. 3.  Encourage the person to put the activity into a finished tray, or put the symbol for the activity into a finished box, to signal that the activity is over. 4.  Use visual supports to show the steps leading up to each activity. 5.  Make the waiting time between activities as short as possible. 6.  Have a visual, concrete representation of how long the person needs to wait before the activity is going to begin - electronic timer. 7.  Consider making enjoyable activities available during transition times - a transition box, containing a number of different activities, could keep the person focused during these times, making  an unstructured timeframe much more structured.  Contact LCSW directly (# [id number]) if you have questions, need assistance, or if additional social work needs are identified between now and our next scheduled telephone outreach call.  Follow-Up:  05/24/2021 at 2:15 pm   Nat Christen LCSW Licensed Clinical Social Worker [place] [phone]

## 2021-05-08 DIAGNOSIS — N400 Enlarged prostate without lower urinary tract symptoms: Secondary | ICD-10-CM

## 2021-05-20 DIAGNOSIS — R3121 Asymptomatic microscopic hematuria: Secondary | ICD-10-CM | POA: Diagnosis not present

## 2021-05-20 DIAGNOSIS — R972 Elevated prostate specific antigen [PSA]: Secondary | ICD-10-CM | POA: Diagnosis not present

## 2021-05-20 LAB — PSA: PSA: 3.56

## 2021-05-24 ENCOUNTER — Telehealth: Payer: Self-pay | Admitting: *Deleted

## 2021-05-24 ENCOUNTER — Telehealth: Payer: Medicare HMO | Admitting: *Deleted

## 2021-05-24 NOTE — Telephone Encounter (Signed)
°  Care Management   Follow Up Note   05/24/2021 Name: Frank Rogers MRN: [id number] DOB: [birth year]   Referred by: [name], NP  Reason for referral : Chronic Care Management Needs in Patient with Mood Disorder, Autism and Benign Prostatic Hyperplasia.  An unsuccessful telephone outreach was attempted today. The patient was referred to the case management team for assistance with care management and care coordination. HIPAA compliant messages were left on voicemail for [employer], Primary Caregiver, providing contact information, encouraging her to return LCSW's call at her earliest convenience.  LCSW will make a second follow-up outreach call attempt within the next two weeks, if a return call is not received from Mrs. Coltrane in the meantime.  Follow-Up Plan:  Request placed with Scheduling Care Guide to reschedule patient's follow-up outreach call with Christa See, newly Embedded Social Work Case Freight forwarder with [facility] [place].  Nat Christen LCSW Licensed Clinical Social Worker [place] [phone]

## 2021-05-31 ENCOUNTER — Telehealth: Payer: Self-pay | Admitting: *Deleted

## 2021-05-31 NOTE — Chronic Care Management (AMB) (Signed)
°  Care Management   Note  05/31/2021 Name: Frank Rogers MRN: [id number] DOB: 15-Jun-1950  Frank Rogers is a 71 y.o. year old male who is a primary care patient of Nafziger, Tommi Rumps, NP and is actively engaged with the care management team. I reached out to [name] by phone today to assist with re-scheduling a follow up visit with the Licensed Clinical Social Worker  Follow up plan: Unsuccessful telephone outreach attempt made. A HIPAA compliant phone message was left for the patient providing contact information and requesting a return call.  The care management team will reach out to the patient again over the next 7 days.  If patient returns call to provider office, please advise to call [place] at [place] Management  Direct Dial: [phone]

## 2021-06-09 NOTE — Chronic Care Management (AMB) (Signed)
°  Care Management   Note  06/09/2021 Name: Pate Aylward MRN: [id number] DOB: April 22, 1951  Sigmond Patalano is a 71 y.o. year old male who is a primary care patient of Nafziger, Tommi Rumps, NP and is actively engaged with the care management team. I reached out to [name] by phone today to assist with re-scheduling a follow up visit with the Licensed Clinical Social Worker  Follow up plan: A second unsuccessful telephone outreach attempt made. A HIPAA compliant phone message was left for the patient providing contact information and requesting a return call. The care management team will reach out to the patient again over the next 7 days. If patient returns call to provider office, please advise to call [place] at [phone].  [place] Management  Direct Dial: [phone]

## 2021-06-14 ENCOUNTER — Telehealth: Payer: Self-pay | Admitting: Adult Health

## 2021-06-14 NOTE — Telephone Encounter (Signed)
Pt caregiver call and stated he need a refill on omeprazole (PRILOSEC) 20 MG capsule sent to  [id number] [place], [place] Phone:  [phone]  Fax:  [phone]

## 2021-06-14 NOTE — Telephone Encounter (Signed)
Lm Rx has refills

## 2021-06-14 NOTE — Chronic Care Management (AMB) (Signed)
°  Care Management   Note  06/14/2021 Name: Ravis Herne MRN: [id number] DOB: [birth year]  Donivan Thammavong is a 71 y.o. year old male who is a primary care patient of Nafziger, Tommi Rumps, NP and is actively engaged with the care management team. I reached out to [name] by phone today to assist with scheduling a follow up visit with the Licensed Clinical Social Worker  Follow up plan: Telephone appointment with care management team member scheduled for:06/18/21  [place] Management  Direct Dial: [phone]

## 2021-06-17 ENCOUNTER — Other Ambulatory Visit: Payer: Self-pay | Admitting: Adult Health

## 2021-06-17 DIAGNOSIS — M1A09X0 Idiopathic chronic gout, multiple sites, without tophus (tophi): Secondary | ICD-10-CM

## 2021-06-17 MED ORDER — ALLOPURINOL 300 MG PO TABS: 300.0000 mg | ORAL_TABLET | Freq: Every day | ORAL | 1 refills | Status: DC

## 2021-06-17 MED ORDER — OMEPRAZOLE 20 MG PO CPDR: 20.0000 mg | DELAYED_RELEASE_CAPSULE | Freq: Every day | ORAL | 1 refills | Status: DC

## 2021-06-17 MED ORDER — BUPROPION HCL ER (XL) 300 MG PO TB24: 300.0000 mg | ORAL_TABLET | Freq: Every day | ORAL | 1 refills | Status: DC

## 2021-06-17 MED ORDER — CITALOPRAM HYDROBROMIDE 10 MG PO TABS: 10.0000 mg | ORAL_TABLET | Freq: Every day | ORAL | 0 refills | Status: DC

## 2021-06-18 ENCOUNTER — Ambulatory Visit (INDEPENDENT_AMBULATORY_CARE_PROVIDER_SITE_OTHER): Payer: No Typology Code available for payment source | Admitting: Licensed Clinical Social Worker

## 2021-06-18 DIAGNOSIS — F39 Unspecified mood [affective] disorder: Secondary | ICD-10-CM

## 2021-06-18 DIAGNOSIS — N400 Enlarged prostate without lower urinary tract symptoms: Secondary | ICD-10-CM

## 2021-06-28 NOTE — Patient Instructions (Signed)
Visit Information  Thank you for taking time to visit with me today. Please don't hesitate to contact me if I can be of assistance to you before our next scheduled telephone appointment.  Following are the goals we discussed today:  Continue to receive personal counseling with LCSW, on a bi-weekly basis, to reduce and manage symptoms of Mood Disorder, until well-managed.   LCSW collaboration with Nurse Practitioner, [name], to report Wellbutrin XL 300 MG, PO, Daily, and Claritin, have proven to be ineffective, according to primary caregivers, Jenny Reichmann and [employer].  Contact LCSW directly (# [phone]) if you have questions, need assistance, or if additional social work needs are identified between now and our next scheduled telephone outreach call.   Our next appointment is by telephone on 07/2421 at 3:00 PM  Please call the care guide team at [phone] if you need to cancel or reschedule your appointment.   If you are experiencing a Mental Health or [place] or need someone to talk to, please call the [place] National Suicide Prevention Lifeline: 506-808-8361 or TTY: 517-266-0121 TTY 260-546-4087) to talk to a trained counselor call 911   Patient verbalizes understanding of instructions and care plan provided today and agrees to view in [place]. Active MyChart status confirmed with patient.    Christa See, MSW, [email or web address] Phone [phone] 9:05 AM

## 2021-06-28 NOTE — Chronic Care Management (AMB) (Signed)
Chronic Care Management    Clinical Social Work Note  06/28/2021 Name: Frank Rogers MRN: [id number] DOB: Feb 10, 1951  Frank Rogers is a 71 y.o. year old male who is a primary care patient of [name], NP. The CCM team was consulted to assist the patient with chronic disease management and/or care coordination needs related to: Mental Health Counseling and Resources.   Engaged with patient's caregiver by telephone for follow up visit in response to provider referral for social work chronic care management and care coordination services.   Consent to Services:  The patient was given information about Chronic Care Management services, agreed to services, and gave verbal consent prior to initiation of services.  Please see initial visit note for detailed documentation.   Patient agreed to services and consent obtained.   Assessment: Review of patient past medical history, allergies, medications, and health status, including review of relevant consultants reports was performed today as part of a comprehensive evaluation and provision of chronic care management and care coordination services.     SDOH (Social Determinants of Health) assessments and interventions performed:    Advanced Directives Status: Not addressed in this encounter.  CCM Care Plan  No Known Allergies  Outpatient Encounter Medications as of 06/18/2021  Medication Sig   allopurinol (ZYLOPRIM) 300 MG tablet Take 1 tablet (300 mg total) by mouth daily.   Aromatic Inhalants (VICKS VAPOINHALER IN) Inhale into the lungs.   atorvastatin (LIPITOR) 10 MG tablet Take 1 tablet (10 mg total) by mouth daily.   buPROPion (WELLBUTRIN XL) 300 MG 24 hr tablet Take 1 tablet (300 mg total) by mouth daily.   citalopram (CELEXA) 10 MG tablet Take 1 tablet (10 mg total) by mouth daily.   omeprazole (PRILOSEC) 20 MG capsule Take 1 capsule (20 mg total) by mouth daily.   OVER THE COUNTER MEDICATION OTC medication for infection  in the foot (name unknown)   No facility-administered encounter medications on file as of 06/18/2021.    Patient Active Problem List   Diagnosis Date Noted   BPH (benign prostatic hyperplasia) 04/14/2017    Conditions to be addressed/monitored:  BPH ; Mental Health Concerns   Care Plan : LCSW Plan of Care  Updates made by Rebekah [place]field, LCSW since 06/28/2021 12:00 AM     Problem: Enhance My Mental and Physical Skills.   Priority: High     Long-Range Goal: Enhance My Mental and Physical Skills.   Start Date: 04/15/2021  Expected End Date: 09/13/2021  This Visit's Progress: On track  Recent Progress: On track  Priority: High  Note:   Current Barriers:   Acute Mental Health needs related to Mood Disorder, Caregiver Stress, and Autism requires Support, Education, Resources, Referrals, Advocacy, and Care Coordination in order to meet unmet mental health needs. Clinical Goal(s):  Patient will work with LCSW to develop a behavior modification plan, to reduce and manage symptoms of mood, isolation, and lack of motivation, and will consistently perform daily chores, without constant reminders and complaints, as well as implement a daily hygiene regimen. Patient will increase knowledge and/or ability of:  Coping Skills, Healthy Habits, Self-Management Skills, Stress Reduction, Home Safety and Utilizing [employer] and Resources.   Clinical Interventions:  Patient's caregiver endorses caregiver stress with caring for patient and elderly mother. Pt has increased irritability and decreased motivation to complete grooming tasks Patient is participating in medication management and caregiver has plans to pick up an recently added medication  Family are interested in  assisting patient with becoming more active-CCM LCSW will mail supportive resources, including, senior resources Family is interested in applying for Medicaid. The received information and plan to apply soon Solution-Focused  Therapy performed, Verbalization of Feelings encouraged, Emotional Support provided, Problem-Solving Solutions developed, Cognitive Behavioral Therapy initiated, Behavior Modification Plan reviewed and implemented, Increase Level of Activity/Exercise emphasized.     Collaboration with Nurse Practitioner, [name] regarding development and update of comprehensive plan of care as evidenced by provider attestation and co-signature. Inter-disciplinary care team collaboration (see longitudinal plan of care). Patient Goals/Self-Care Activities: Continue to receive personal counseling with LCSW, on a bi-weekly basis, to reduce and manage symptoms of Mood Disorder, until well-managed.   LCSW collaboration with Nurse Practitioner, [name], to report Wellbutrin XL 300 MG, PO, Daily, and Claritin, have proven to be ineffective, according to primary caregivers, Jenny Reichmann and [employer].  Contact LCSW directly (# [phone]) if you have questions, need assistance, or if additional social work needs are identified between now and our next scheduled telephone outreach call.        Christa See, MSW, [email or web address] Phone [phone] 9:03 AM

## 2021-07-06 DIAGNOSIS — N400 Enlarged prostate without lower urinary tract symptoms: Secondary | ICD-10-CM

## 2021-07-14 DIAGNOSIS — R3121 Asymptomatic microscopic hematuria: Secondary | ICD-10-CM | POA: Diagnosis not present

## 2021-07-28 DIAGNOSIS — R972 Elevated prostate specific antigen [PSA]: Secondary | ICD-10-CM | POA: Diagnosis not present

## 2021-07-28 DIAGNOSIS — R3912 Poor urinary stream: Secondary | ICD-10-CM | POA: Diagnosis not present

## 2021-07-28 DIAGNOSIS — N200 Calculus of kidney: Secondary | ICD-10-CM | POA: Diagnosis not present

## 2021-07-28 DIAGNOSIS — N401 Enlarged prostate with lower urinary tract symptoms: Secondary | ICD-10-CM | POA: Diagnosis not present

## 2021-07-30 ENCOUNTER — Telehealth: Payer: Self-pay | Admitting: *Deleted

## 2021-07-30 ENCOUNTER — Telehealth: Payer: No Typology Code available for payment source

## 2021-07-30 NOTE — Telephone Encounter (Addendum)
?  Care Management  ? ?Follow Up Note ? ? ?07/30/2021 ?Name: Melville Engen MRN: [id number] DOB: 01-Sep-1950 ? ? ?Referred by: [name], NP ?Reason for referral : Care Coordination ? ? ?An unsuccessful telephone outreach was attempted today. The patient was referred to the case management team for assistance with care management and care coordination.  Outreach call made to [employer] ? ?Follow Up Plan: Telephone follow up appointment with care management team member to be re-scheduled by Care guide: ? ?Ortha Metts, LCSW ?[facility] ?[phone] ? ? ?

## 2021-08-04 ENCOUNTER — Encounter: Payer: Self-pay | Admitting: Adult Health

## 2021-08-18 ENCOUNTER — Encounter: Payer: Self-pay | Admitting: Podiatry

## 2021-08-18 ENCOUNTER — Ambulatory Visit (INDEPENDENT_AMBULATORY_CARE_PROVIDER_SITE_OTHER): Payer: No Typology Code available for payment source | Admitting: Podiatry

## 2021-08-18 DIAGNOSIS — M79675 Pain in left toe(s): Secondary | ICD-10-CM | POA: Diagnosis not present

## 2021-08-18 DIAGNOSIS — B351 Tinea unguium: Secondary | ICD-10-CM | POA: Diagnosis not present

## 2021-08-18 DIAGNOSIS — M79674 Pain in right toe(s): Secondary | ICD-10-CM

## 2021-08-18 NOTE — Progress Notes (Signed)
This patient returns to the office for evaluation and treatment of long thick painful nails .  This patient is unable to trim his own nails since the patient cannot reach his feet.  Patient says the nails are painful walking and wearing his shoes.  He returns for preventive foot care services.  General Appearance  Alert, conversant and in no acute stress.  Vascular  Dorsalis pedis and posterior tibial  pulses are palpable  bilaterally.  Capillary return is within normal limits  bilaterally. Temperature is within normal limits  bilaterally.  Neurologic  Senn-Weinstein monofilament wire test within normal limits  bilaterally. Muscle power within normal limits bilaterally.  Nails Thick disfigured discolored nails with subungual debris  from hallux to fifth toes bilaterally. No evidence of bacterial infection or drainage bilaterally.  Orthopedic  No limitations of motion  feet .  No crepitus or effusions noted.  No bony pathology or digital deformities noted.  HAV  B/L.    Skin  normotropic skin with no porokeratosis noted bilaterally.  No signs of infections or ulcers noted.     Onychomycosis  Pain in toes right foot  Pain in toes left foot  Debridement  of nails  1-5  B/L with a nail nipper.  Nails were then filed using a dremel tool with no incidents. RTC 3 months.   Leland Staszewski DPM   

## 2021-08-31 ENCOUNTER — Telehealth: Payer: Self-pay | Admitting: *Deleted

## 2021-08-31 NOTE — Chronic Care Management (AMB) (Signed)
?  Care Management  ? ?Note ? ?08/31/2021 ?Name: Frank Rogers MRN: [id number] DOB: [birth year] ? ?Frank Rogers is a 71 y.o. year old male who is a primary care patient of Nafziger, Tommi Rumps, NP and is actively engaged with the care management team. I reached out to [name] by phone today to assist with re-scheduling a follow up visit with the Licensed Clinical Social Worker ? ?Follow up plan: ?Unsuccessful telephone outreach attempt made. A HIPAA compliant phone message was left for the patient providing contact information and requesting a return call.  ?The care management team will reach out to the patient again over the next 7 days.  ?If patient returns call to provider office, please advise to call [place]  at [phone]. ? ?Laverda Sorenson  ?Care Guide, Embedded Care Coordination ?[place]  Care Management  ?Direct Dial: [phone] ? ?

## 2021-09-07 NOTE — Chronic Care Management (AMB) (Signed)
?  Care Management  ? ?Note ? ?09/07/2021 ?Name: Frank Rogers MRN: [id number] DOB: [birth year] ? ?Frank Rogers is a 71 y.o. year old male who is a primary care patient of Nafziger, Tommi Rumps, NP and is actively engaged with the care management team. I reached out to Frank Rogers by phone today to assist with re-scheduling a follow up visit with the Licensed Clinical Social Worker ? ?Follow up plan: ?Telephone appointment with care management team member scheduled for:09/16/21 ? ?Frank Rogers  ?Care Guide, Embedded Care Coordination ?[place]  Care Management  ?Direct Dial: [phone] ? ?

## 2021-09-16 ENCOUNTER — Telehealth: Payer: No Typology Code available for payment source

## 2021-09-16 ENCOUNTER — Other Ambulatory Visit: Payer: Self-pay | Admitting: Adult Health

## 2021-09-20 ENCOUNTER — Telehealth: Payer: Self-pay | Admitting: Licensed Clinical Social Worker

## 2021-09-20 NOTE — Telephone Encounter (Signed)
? ?   Clinical Social Work  ?Care Management  ? Phone Outreach  ? ? ?09/20/2021 ?Name: Xayvier Vallez MRN: [id number] DOB: [birth year] ? ?Sephiroth Mcluckie is a 71 y.o. year old male who is a primary care patient of [name], NP .  ? ?Reason for referral: Caregiver Stress.   ? ?F/U phone call today to assess needs, progress and barriers with care plan goals.   Telephone outreach was unsuccessful. A HIPPA compliant phone message was left for the patient providing contact information and requesting a return call.  ? ?Plan:CCM LCSW will wait for return call. If no return call is received, Will route chart to Care Guide to see if patient would like to reschedule phone appointment  ? ?Review of patient status, including review of consultants reports, relevant laboratory and other test results, and collaboration with appropriate care team members and the patient's provider was performed as part of comprehensive patient evaluation and provision of care management services.   ? ?Christa See, MSW, LCSW ?[facility] ?[facility] ?[email or web address] ?Phone [phone] ?8:40 AM  ? ? ?  ? ? ? ? ?

## 2021-10-08 ENCOUNTER — Telehealth: Payer: Self-pay | Admitting: *Deleted

## 2021-10-08 NOTE — Chronic Care Management (AMB) (Signed)
  Chronic Care Management Note  10/08/2021 Name: Frank Rogers MRN: [id number] DOB: 15-Apr-1951  Frank Rogers is a 71 y.o. year old male who is a primary care patient of Nafziger, Tommi Rumps, NP and is actively engaged with the care management team. I reached out to [name] by phone today to assist with re-scheduling a follow up visit with the Licensed Clinical Social Worker  Follow up plan: Unsuccessful telephone outreach attempt made. A HIPAA compliant phone message was left for the patient providing contact information and requesting a return call.  The care management team will reach out to the patient again over the next 7 days.  If patient returns call to provider office, please advise to call [place] at [phone].  [facility] Management  Direct Dial: [phone]

## 2021-10-18 NOTE — Chronic Care Management (AMB) (Signed)
  Chronic Care Management Note  10/18/2021 Name: Frank Rogers MRN: [id number] DOB: Sep 30, 1950  Frank Rogers is a 71 y.o. year old male who is a primary care patient of Nafziger, Tommi Rumps, NP and is actively engaged with the care management team. I reached out to [name] by phone today to assist with re-scheduling a follow up visit with the Licensed Clinical Social Worker  Follow up plan: Unsuccessful telephone outreach attempt made. A HIPAA compliant phone message was left for the patient providing contact information and requesting a return call.  The care management team will reach out to the patient again over the next 7 days.  If patient returns call to provider office, please advise to call [place] at [phone].  [facility] Management  Direct Dial: [phone]

## 2021-10-25 NOTE — Chronic Care Management (AMB) (Signed)
  Chronic Care Management Note  10/25/2021 Name: Frank Rogers MRN: [id number] DOB: 02/14/1951  Frank Rogers is a 71 y.o. year old male who is a primary care patient of Nafziger, Tommi Rumps, NP and is actively engaged with the care management team. I reached out to [name] by phone today to assist with re-scheduling a follow up visit with the Licensed Clinical Social Worker  Follow up plan: A third unsuccessful telephone outreach attempt made. A HIPAA compliant phone message was left for the patient providing contact information and requesting a return call. Unable to make contact on outreach attempts x 3. PCP [name], NP notified via routed documentation in medical record. We have been unable to make contact with the patient for follow up. The care management team is available to follow up with the patient after provider conversation with the patient regarding recommendation for care management engagement and subsequent re-referral to the care management team. If patient returns call to provider office, please advise to call [place] at [phone]. [place] Management  Direct Dial: [phone]

## 2021-10-30 IMAGING — MR MR ABDOMEN WO/W CM
18 series · 48 of 48 positions shown · IV contrast (gadavist)
Comparison: CT abdomen pelvis, 09/06/2019

CLINICAL DATA: Gross hematuria, suspicious lesion of the inferior
pole of the left kidney on prior CT, additional incidental lesion of
the anterior left lobe of the liver

EXAM:
MRI ABDOMEN WITHOUT AND WITH CONTRAST
TECHNIQUE: Multiplanar multisequence MR imaging of the abdomen was performed
both before and after the administration of intravenous contrast.
CONTRAST:  7mL GADAVIST GADOBUTROL 1 MMOL/ML IV SOLN

[Series 2: T2 · coronal · 6.0mm · 1.56mm/px · 2 of 38 slices shown (1 of 2)]
[im 1/38]
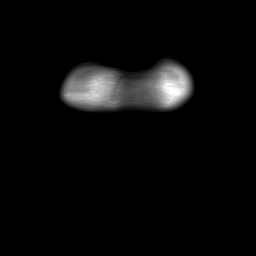
[im 38/38]
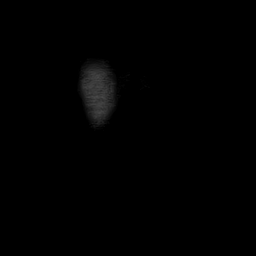

[Series 4: T2 fat-sat · axial · 6.0mm · 1.25mm/px · z∈[-96,+170]mm · 2 of 38 slices shown]
[im 1/38]
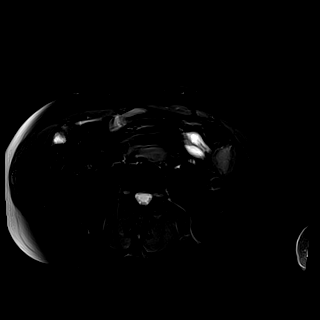
[im 38/38]
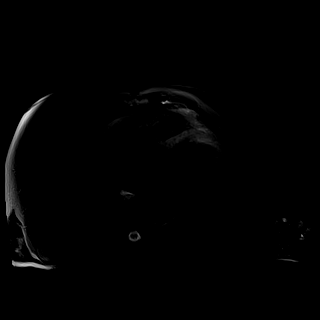

[Series 5: T1 · axial · 3.0mm · 1.25mm/px · z∈[-98,+163]mm · 4 of 88 slices shown (1 of 2)]
[im 1/88]
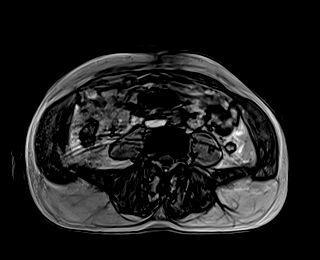
[im 30/88]
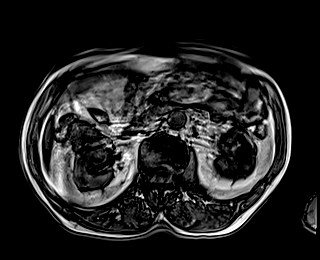
[im 59/88]
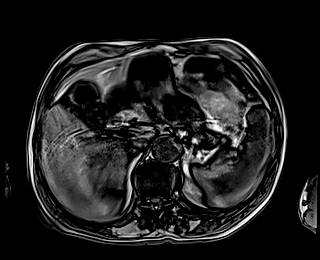
[im 88/88]
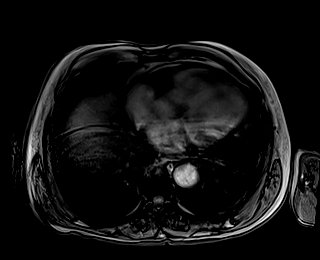

[Series 6: T1 · axial · 3.0mm · 1.25mm/px · z∈[-98,+163]mm · 4 of 88 slices shown (2 of 2)]
[im 1/88]
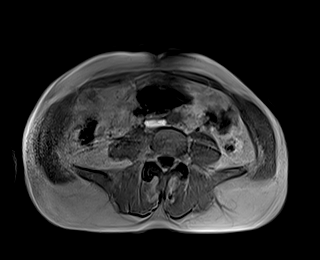
[im 30/88]
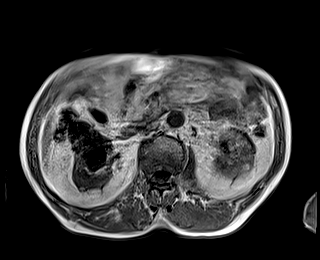
[im 59/88]
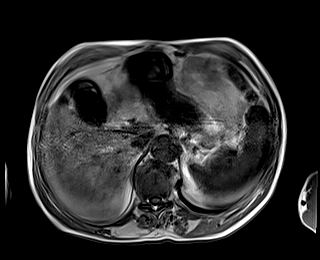
[im 88/88]
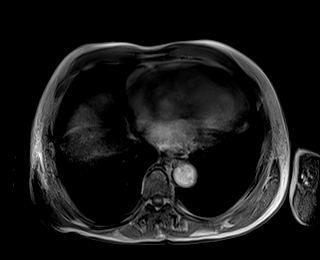

[Series 7: DWI · axial · 6.0mm · 1.49mm/px · z∈[-96,+170]mm · 4 of 76 slices shown (1 of 2)]
[im 1/76]
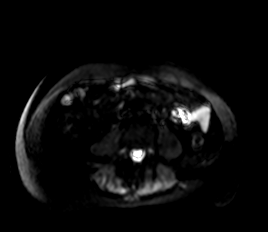
[im 26/76]
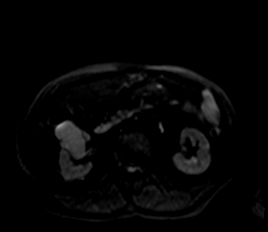
[im 51/76]
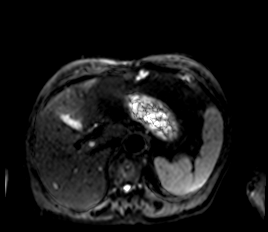
[im 76/76]
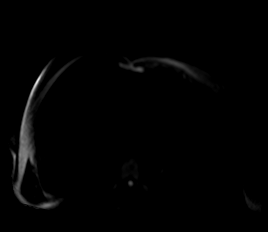

[Series 8: DWI · axial · 6.0mm · 1.49mm/px · 1 of 38 slices shown (2 of 2)]
[im 1/38]
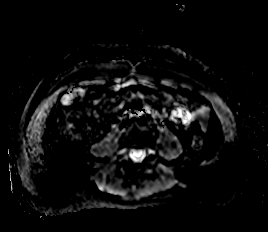

[Series 9: bSSFP · axial · 5.0mm · 0.84mm/px · z∈[-96,+119]mm · 2 of 40 slices shown]
[im 1/40]
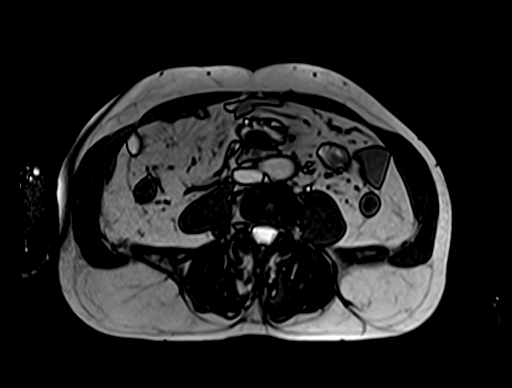
[im 40/40]
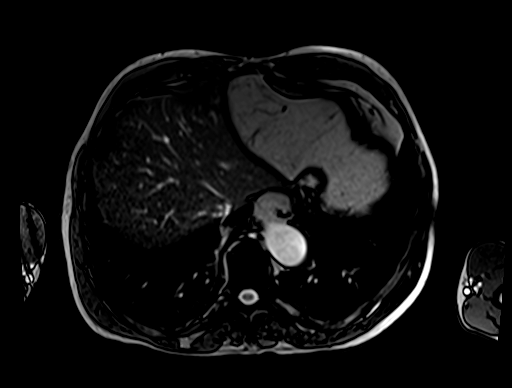

[Series 11: T1 dynamic · axial · 3.0mm · 1.25mm/px · z∈[-111,+150]mm · 3 of 88 slices shown (1 of 6)]
[im 1/88]
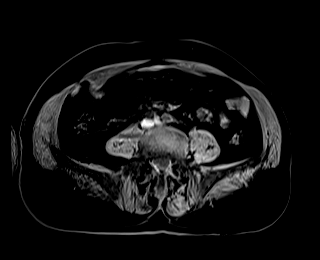
[im 44/88]
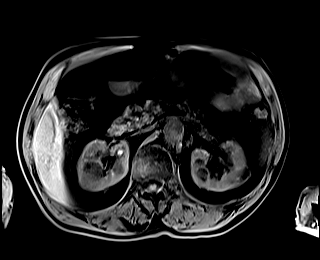
[im 88/88]
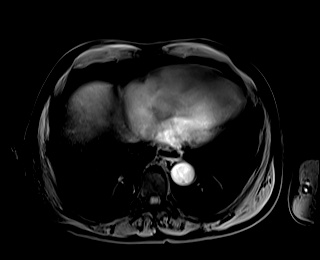

[Series 14: T1 dynamic · axial · 3.0mm · 1.25mm/px · z∈[-111,+150]mm · 3 of 88 slices shown (2 of 6)]
[im 1/88]
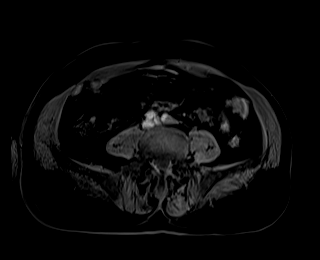
[im 44/88]
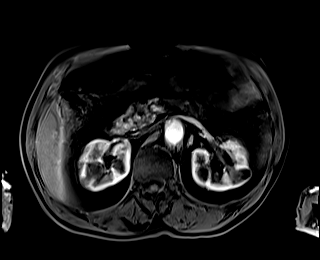
[im 88/88]
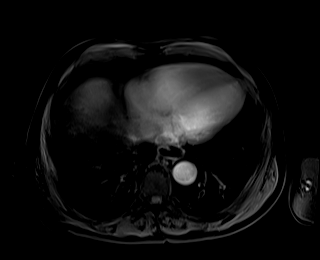

[Series 16: T1 dynamic · axial · 3.0mm · 1.25mm/px · z∈[-111,+150]mm · 3 of 88 slices shown (3 of 6)]
[im 1/88]
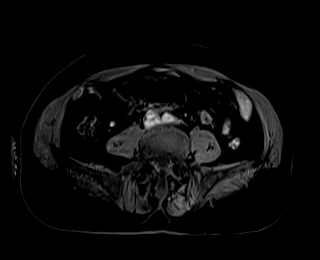
[im 44/88]
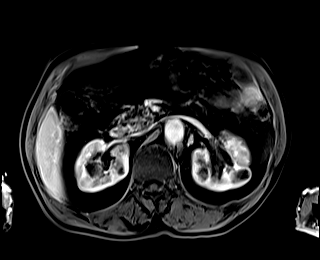
[im 88/88]
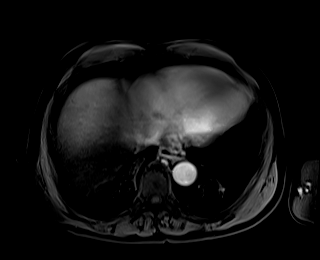

[Series 18: T1 dynamic · axial · 3.0mm · 1.25mm/px · z∈[-111,+150]mm · 3 of 88 slices shown (4 of 6)]
[im 1/88]
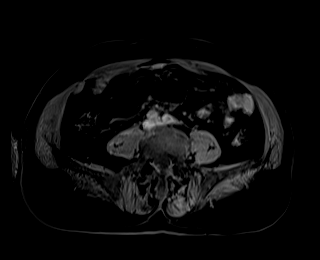
[im 44/88]
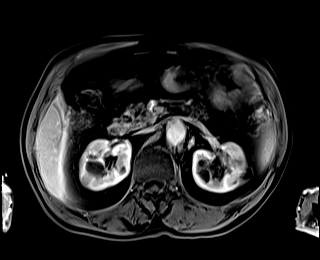
[im 88/88]
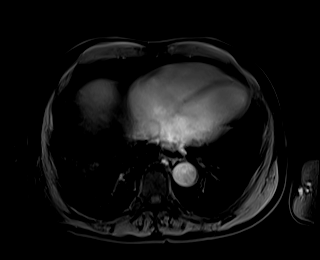

[Series 20: T1 dynamic · coronal · 4.0mm · 1.41mm/px · 2 of 64 slices shown (5 of 6)]
[im 1/64]
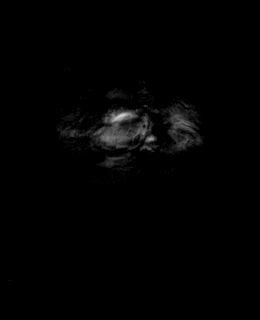
[im 64/64]
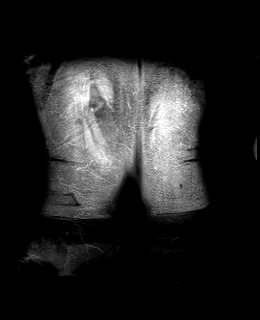

[Series 21: T2 · axial · 6.0mm · 1.56mm/px · 1 of 30 slices shown (2 of 2)]
[im 1/30]
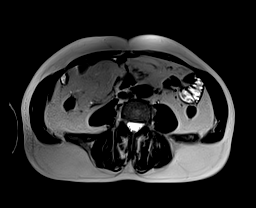

[Series 23: T1 dynamic · axial · 3.0mm · 1.25mm/px · z∈[-111,+150]mm · 3 of 88 slices shown (6 of 6)]
[im 1/88]
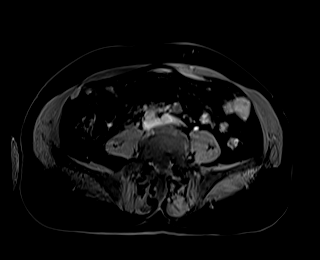
[im 44/88]
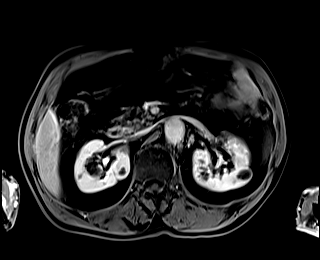
[im 88/88]
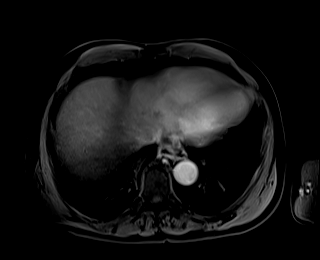

[Series 100: sub_s20 sec · axial · 3.0mm · 1.25mm/px · z∈[-111,+150]mm · 3 of 88 slices shown]
[im 1/88]
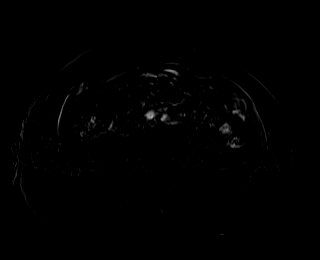
[im 44/88]
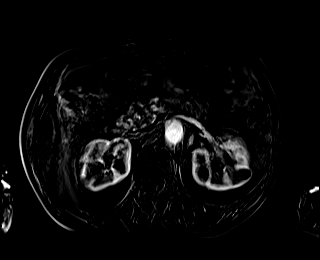
[im 88/88]
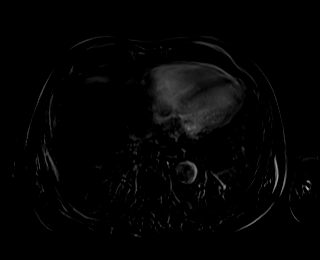

[Series 101: sub_45 sec · axial · 3.0mm · 1.25mm/px · z∈[-111,+150]mm · 3 of 88 slices shown]
[im 1/88]
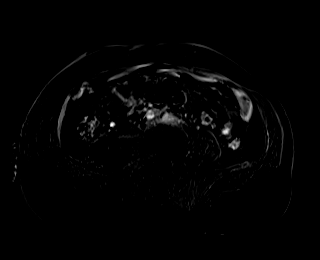
[im 44/88]
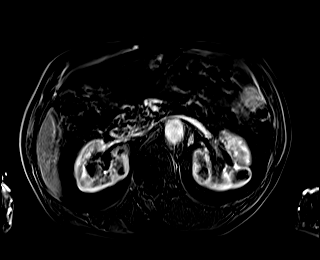
[im 88/88]
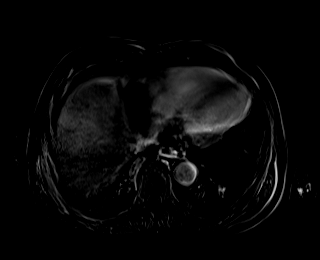

[Series 102: sub_90 sec · axial · 3.0mm · 1.25mm/px · z∈[-111,+150]mm · 3 of 88 slices shown]
[im 1/88]
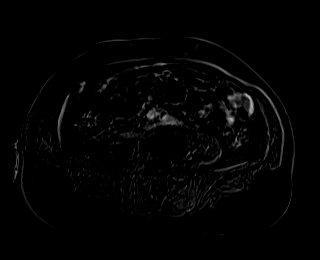
[im 44/88]
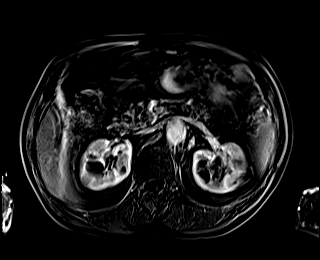
[im 88/88]
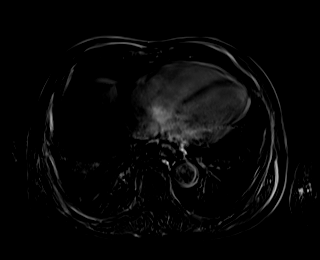

[Series 103: subdelay · axial · 3.0mm · 1.25mm/px · z∈[+3,+150]mm · 2 of 50 slices shown]
[im 1/50]
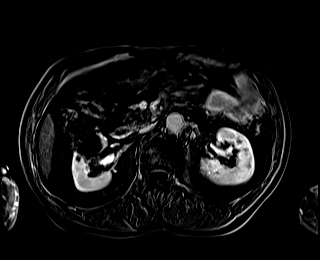
[im 50/50]
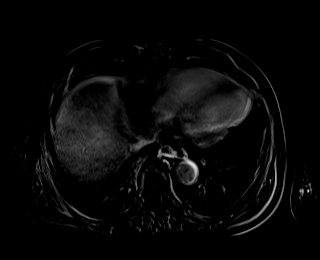

[48 of 48 positions shown; findings below may reference images not displayed]

FINDINGS: Examination is generally limited by breath motion artifact
throughout.

Lower chest: No acute abnormality.

Hepatobiliary: Examination of the liver is very limited by extensive
breath motion artifact on essentially all sequences provided. Within
this limitation, previously noted hypervascular lesion of the left
lobe of the liver is not appreciated. There are multiple
nonenhancing fluid signal cysts. No gallstones, gallbladder wall
thickening, or biliary dilatation.

Pancreas: Unremarkable. No pancreatic ductal dilatation or
surrounding inflammatory changes.

Spleen: Normal in size without significant abnormality.

Adrenals/Urinary Tract: Adrenal glands are unremarkable. There are
numerous bilateral renal parenchymal and calyceal cysts without
evidence of contrast enhancement, particular attention to an
exophytic cyst of the inferior pole left kidney. Some cysts
demonstrate intrinsic T1 hyperintensity, consistent with hemorrhage
or proteinaceous contents. There are multiple nonobstructive renal
calculi bilaterally. No hydronephrosis. Bladder is unremarkable.

Stomach/Bowel: Stomach is within normal limits. Appendix appears
normal. No evidence of bowel wall thickening, distention, or
inflammatory changes.

Vascular/Lymphatic: No significant vascular findings are present. No
enlarged abdominal or pelvic lymph nodes.

Reproductive: No mass or other significant abnormality.

Other: No abdominal wall hernia or abnormality. No abdominopelvic
ascites.

Musculoskeletal: Levoscoliosis of the lumbar spine.
IMPRESSION: 1. Examination is generally limited by breath motion artifact
throughout.
2. Evaluation of the liver is in particular very limited by breath
motion artifact. The previously noted hypervascular lesion of the
left lobe of the liver is not appreciated on current examination and
of uncertain significance.
3. Numerous bilateral renal parenchymal and calyceal cysts without
evidence of contrast enhancement, with particular attention to an
exophytic lesion of the inferior pole the left kidney. There are
multiple hemorrhagic and proteinaceous cysts, including the lesion
in question.
4. Multiple nonobstructive renal calculi bilaterally.

## 2021-12-15 ENCOUNTER — Telehealth: Payer: Self-pay

## 2021-12-15 ENCOUNTER — Ambulatory Visit: Payer: No Typology Code available for payment source

## 2021-12-15 ENCOUNTER — Ambulatory Visit: Payer: Medicare HMO

## 2021-12-15 NOTE — Telephone Encounter (Signed)
Unsuccessful attempt to reach patient on preferred number listed in notes for scheduled AWV. Left message on voicemail okay to reschedule. 

## 2021-12-20 ENCOUNTER — Telehealth: Payer: Self-pay | Admitting: Adult Health

## 2021-12-20 ENCOUNTER — Ambulatory Visit: Payer: No Typology Code available for payment source | Admitting: Podiatry

## 2021-12-20 NOTE — Telephone Encounter (Signed)
Left message for patient to call back and schedule Medicare Annual Wellness Visit (AWV) either virtually or in office. Left  my Herbie Drape number [phone]   Last AWV 12/02/20 ; please schedule at anytime with [facility] Nurse Health Advisor 1 or 2

## 2022-01-18 ENCOUNTER — Telehealth: Payer: Self-pay

## 2022-01-18 NOTE — Telephone Encounter (Signed)
Unsuccessful attempt to reach patient on preferred number listed in notes for scheduled AWV. Left message on voicemail okay to reschedule. 

## 2022-01-19 DIAGNOSIS — F17290 Nicotine dependence, other tobacco product, uncomplicated: Secondary | ICD-10-CM | POA: Diagnosis not present

## 2022-01-19 DIAGNOSIS — K219 Gastro-esophageal reflux disease without esophagitis: Secondary | ICD-10-CM | POA: Diagnosis not present

## 2022-01-19 DIAGNOSIS — Z008 Encounter for other general examination: Secondary | ICD-10-CM | POA: Diagnosis not present

## 2022-01-19 DIAGNOSIS — F39 Unspecified mood [affective] disorder: Secondary | ICD-10-CM | POA: Diagnosis not present

## 2022-01-19 DIAGNOSIS — Z6823 Body mass index (BMI) 23.0-23.9, adult: Secondary | ICD-10-CM | POA: Diagnosis not present

## 2022-01-19 DIAGNOSIS — N400 Enlarged prostate without lower urinary tract symptoms: Secondary | ICD-10-CM | POA: Diagnosis not present

## 2022-01-20 ENCOUNTER — Telehealth: Payer: Self-pay | Admitting: Adult Health

## 2022-01-20 NOTE — Telephone Encounter (Signed)
Left message for patient to call back and schedule Medicare Annual Wellness Visit (AWV) either virtually or in office. Left  my Herbie Drape number [phone]   Last AWV 12/02/20 ; please schedule at anytime with [facility] Nurse Health Advisor 1 or 2

## 2022-02-25 ENCOUNTER — Telehealth: Payer: Self-pay | Admitting: Adult Health

## 2022-02-25 NOTE — Telephone Encounter (Signed)
Left message for patient to call back and schedule Medicare Annual Wellness Visit (AWV) either virtually or in office. Left  my jabber number [phone]   Last AWV 12/02/20 please schedule with Nurse Health Adviser   45 min for awv-i and in office appointments 30 min for awv-s  phone/virtual appointments  

## 2022-03-08 ENCOUNTER — Telehealth: Payer: Self-pay

## 2022-03-08 NOTE — Telephone Encounter (Signed)
Contacted patient on preferred number listed in notes for scheduled AWV. Patient stated unable to complete visit today will call back and reschedule.

## 2022-03-15 ENCOUNTER — Ambulatory Visit (INDEPENDENT_AMBULATORY_CARE_PROVIDER_SITE_OTHER): Payer: No Typology Code available for payment source | Admitting: Adult Health

## 2022-03-15 ENCOUNTER — Encounter: Payer: Self-pay | Admitting: Adult Health

## 2022-03-15 VITALS — BP 100/60 | HR 70 | Temp 97.7°F | Ht 70.0 in | Wt 142.0 lb

## 2022-03-15 DIAGNOSIS — Z23 Encounter for immunization: Secondary | ICD-10-CM | POA: Diagnosis not present

## 2022-03-15 DIAGNOSIS — E782 Mixed hyperlipidemia: Secondary | ICD-10-CM | POA: Diagnosis not present

## 2022-03-15 DIAGNOSIS — M1A09X0 Idiopathic chronic gout, multiple sites, without tophus (tophi): Secondary | ICD-10-CM

## 2022-03-15 DIAGNOSIS — Z0000 Encounter for general adult medical examination without abnormal findings: Secondary | ICD-10-CM

## 2022-03-15 DIAGNOSIS — N400 Enlarged prostate without lower urinary tract symptoms: Secondary | ICD-10-CM

## 2022-03-15 DIAGNOSIS — K2100 Gastro-esophageal reflux disease with esophagitis, without bleeding: Secondary | ICD-10-CM | POA: Diagnosis not present

## 2022-03-15 DIAGNOSIS — F39 Unspecified mood [affective] disorder: Secondary | ICD-10-CM

## 2022-03-15 MED ORDER — ATORVASTATIN CALCIUM 10 MG PO TABS: 10.0000 mg | ORAL_TABLET | Freq: Every day | ORAL | 3 refills | Status: DC

## 2022-03-15 MED ORDER — CITALOPRAM HYDROBROMIDE 10 MG PO TABS: 10.0000 mg | ORAL_TABLET | Freq: Every day | ORAL | 1 refills | Status: DC

## 2022-03-15 MED ORDER — TAMSULOSIN HCL 0.4 MG PO CAPS: 0.4000 mg | ORAL_CAPSULE | Freq: Every day | ORAL | 3 refills | Status: DC

## 2022-03-15 MED ORDER — OMEPRAZOLE 40 MG PO CPDR: 40.0000 mg | DELAYED_RELEASE_CAPSULE | Freq: Every day | ORAL | 3 refills | Status: DC

## 2022-03-15 MED ORDER — ALLOPURINOL 300 MG PO TABS: 300.0000 mg | ORAL_TABLET | Freq: Every day | ORAL | 1 refills | Status: DC

## 2022-03-15 MED ORDER — BUPROPION HCL ER (XL) 300 MG PO TB24: 300.0000 mg | ORAL_TABLET | Freq: Every day | ORAL | 3 refills | Status: DC

## 2022-03-15 MED ORDER — FINASTERIDE 5 MG PO TABS: ORAL_TABLET | ORAL | 3 refills | Status: DC

## 2022-03-15 NOTE — Progress Notes (Signed)
Subjective:    Patient ID: Frank Rogers, male    DOB: 02-13-1951, 71 y.o.   MRN: [id number]  HPI Patient presents for yearly preventative medicine examination. He is a pleasant 71 year old male who  has a past medical history of Arthritis, Chicken pox, Migraines, and Seasonal allergies.  History of gout-currently managed with allopurinol 300 mg daily.  He does try and refrain from foods and drinks that could cause gout flares.  He has not had any gout flare in quite some time  Hyperlipidemia-managed with Lipitor 10 mg daily.  He denies myalgia or fatigue  BPH-managed with Flomax 0.4 mg daily and Proscar 5 mg daily.  Denies symptoms  Mood disorder-well-controlled with Wellbutrin 300 mg extended release and Celexa 10 mg daily.  GERD-managed with Prilosec 20 mg daily.  He does report intermittent flares of acid reflux.  He does not eat acidic foods or fast foods.   All immunizations and health maintenance protocols were reviewed with the patient and needed orders were placed.  Appropriate screening laboratory values were ordered for the patient including screening of hyperlipidemia, renal function and hepatic function.  Medication reconciliation,  past medical history, social history, problem list and allergies were reviewed in detail with the patient  Goals were established with regard to weight loss, exercise, and  diet in compliance with medications Wt Readings from Last 3 Encounters:  03/15/22 142 lb (64.4 kg)  03/17/21 148 lb (67.1 kg)  09/08/20 157 lb 6.4 oz (71.4 kg)    Review of Systems  Constitutional: Negative.   HENT: Negative.    Eyes: Negative.   Respiratory: Negative.    Cardiovascular: Negative.   Gastrointestinal:  Positive for abdominal pain.  Endocrine: Negative.   Genitourinary: Negative.   Musculoskeletal: Negative.   Allergic/Immunologic: Negative.   Neurological: Negative.   Psychiatric/Behavioral: Negative.    All other systems reviewed and are  negative.  Past Medical History:  Diagnosis Date   Arthritis    Chicken pox    Migraines    Seasonal allergies     Social History   Socioeconomic History   Marital status: Single    Spouse name: Not on file   Number of children: 0   Years of education: 10th   Highest education level: 10th grade  Occupational History   Not on file  Tobacco Use   Smoking status: Never    Passive exposure: Never   Smokeless tobacco: Never  Vaping Use   Vaping Use: Never used  Substance and Sexual Activity   Alcohol use: Not Currently    Alcohol/week: 4.0 standard drinks of alcohol    Types: 4 Cans of beer per week   Drug use: No   Sexual activity: Not Currently  Other Topics Concern   Not on file  Social History Narrative   He is retired    Not married    Social Determinants of Health   Financial Resource Strain: [place]  (04/15/2021)   Overall Financial Resource Strain (CARDIA)    Difficulty of Paying Living Expenses: Not hard at all  Food Insecurity: No Food Insecurity (04/15/2021)   Hunger Vital Sign    Worried About Running Out of Food in the Last Year: Never true    [place] in the Last Year: Never true  Transportation Needs: No Transportation Needs (04/15/2021)   PRAPARE - Hydrologist (Medical): No    Lack of Transportation (Non-Medical): No  Physical Activity:  Inactive (04/15/2021)   Exercise Vital Sign    Days of Exercise per Week: 0 days    Minutes of Exercise per Session: 0 min  Stress: No Stress Concern Present (04/15/2021)   [place]    Feeling of Stress : Not at all  Social Connections: Moderately Isolated (04/15/2021)   Social Connection and Isolation Panel [NHANES]    Frequency of Communication with Friends and Family: More than three times a week    Frequency of Social Gatherings with Friends and Family: Once a week    Attends Religious Services: More than 4  times per year    Active Member of [employer] or Organizations: No    Attends Archivist Meetings: Never    Marital Status: Never married  Intimate Partner Violence: Not At Risk (04/15/2021)   Humiliation, Afraid, Rape, and Kick questionnaire    Fear of Current or Ex-Partner: No    Emotionally Abused: No    Physically Abused: No    Sexually Abused: No    History reviewed. No pertinent surgical history.  Family History  Problem Relation Age of Onset   Arthritis Mother    Hearing loss Mother    Stroke Mother    Diabetes Father    Hearing loss Father    Heart attack Sister    Hyperlipidemia Sister    Hypertension Sister     No Known Allergies  Current Outpatient Medications on File Prior to Visit  Medication Sig Dispense Refill   Aromatic Inhalants (VICKS VAPOINHALER IN) Inhale into the lungs.     OVER THE COUNTER MEDICATION OTC medication for infection in the foot (name unknown)     No current facility-administered medications on file prior to visit.    BP 100/60   Pulse 70   Temp 97.7 F (36.5 C) (Oral)   Ht '5\' 10"'$  (1.778 m)   Wt 142 lb (64.4 kg)   SpO2 98%   BMI 20.37 kg/m       Objective:   Physical Exam Vitals and nursing note reviewed.  Constitutional:      General: He is not in acute distress.    Appearance: Normal appearance. He is well-developed and normal weight.  HENT:     Head: Normocephalic and atraumatic.     Right Ear: Tympanic membrane, ear canal and external ear normal. There is no impacted cerumen.     Left Ear: Tympanic membrane, ear canal and external ear normal. There is no impacted cerumen.     Nose: Nose normal. No congestion or rhinorrhea.     Mouth/Throat:     Mouth: Mucous membranes are moist.     Pharynx: Oropharynx is clear. No oropharyngeal exudate or posterior oropharyngeal erythema.  Eyes:     General:        Right eye: No discharge.        Left eye: No discharge.     Extraocular Movements: Extraocular movements intact.      Conjunctiva/sclera: Conjunctivae normal.     Pupils: Pupils are equal, round, and reactive to light.  Neck:     Vascular: No carotid bruit.     Trachea: No tracheal deviation.  Cardiovascular:     Rate and Rhythm: Normal rate and regular rhythm.     Pulses: Normal pulses.     Heart sounds: Normal heart sounds. No murmur heard.    No friction rub. No gallop.  Pulmonary:     Effort: Pulmonary effort  is normal. No respiratory distress.     Breath sounds: Normal breath sounds. No stridor. No wheezing, rhonchi or rales.  Chest:     Chest wall: No tenderness.  Abdominal:     General: Bowel sounds are normal. There is no distension.     Palpations: Abdomen is soft. There is no mass.     Tenderness: There is abdominal tenderness in the epigastric area. There is no right CVA tenderness, left CVA tenderness, guarding or rebound.     Hernia: No hernia is present.  Musculoskeletal:        General: No swelling, tenderness, deformity or signs of injury. Normal range of motion.     Right lower leg: No edema.     Left lower leg: No edema.  Lymphadenopathy:     Cervical: No cervical adenopathy.  Skin:    General: Skin is warm and dry.     Capillary Refill: Capillary refill takes less than 2 seconds.     Coloration: Skin is not jaundiced or pale.     Findings: No bruising, erythema, lesion or rash.  Neurological:     General: No focal deficit present.     Mental Status: He is alert and oriented to person, place, and time.     Cranial Nerves: No cranial nerve deficit.     Sensory: No sensory deficit.     Motor: No weakness.     Coordination: Coordination normal.     Gait: Gait normal.     Deep Tendon Reflexes: Reflexes normal.  Psychiatric:        Mood and Affect: Mood normal.        Behavior: Behavior normal.        Thought Content: Thought content normal.        Judgment: Judgment normal.       Assessment & Plan:  1. Routine general medical examination at a health care  facility - Follow up in one year or sooner if needed - CBC with Differential/Platelet; Future - Comprehensive metabolic panel; Future - Hemoglobin A1c; Future - Lipid panel; Future - TSH; Future  2. Chronic gout of multiple sites, unspecified cause  - Uric Acid; Future - allopurinol (ZYLOPRIM) 300 MG tablet; Take 1 tablet (300 mg total) by mouth daily.  Dispense: 90 tablet; Refill: 1  3. Mood disorder (HCC)  - buPROPion (WELLBUTRIN XL) 300 MG 24 hr tablet; Take 1 tablet (300 mg total) by mouth daily.  Dispense: 90 tablet; Refill: 3 - citalopram (CELEXA) 10 MG tablet; Take 1 tablet (10 mg total) by mouth daily.  Dispense: 90 tablet; Refill: 1  4. Benign prostatic hyperplasia without lower urinary tract symptoms  - PSA; Future - finasteride (PROSCAR) 5 MG tablet; TAKE AS DIRECTED  Dispense: 90 tablet; Refill: 3 - tamsulosin (FLOMAX) 0.4 MG CAPS capsule; Take 1 capsule (0.4 mg total) by mouth daily.  Dispense: 90 capsule; Refill: 3  5. Gastroesophageal reflux disease with esophagitis without hemorrhage - Will increase Prilosec to 40 mg - Follow up if GERD does not improve  - CBC with Differential/Platelet; Future - Comprehensive metabolic panel; Future - Hemoglobin A1c; Future - Lipid panel; Future - TSH; Future - omeprazole (PRILOSEC) 40 MG capsule; Take 1 capsule (40 mg total) by mouth daily.  Dispense: 90 capsule; Refill: 3  6. Mixed hyperlipidemia  - atorvastatin (LIPITOR) 10 MG tablet; Take 1 tablet (10 mg total) by mouth daily.  Dispense: 90 tablet; Refill: 3  7. Need for immunization against influenza  -  Flu Vaccine QUAD High Dose(Fluad)  [name], NP

## 2022-04-14 ENCOUNTER — Other Ambulatory Visit (INDEPENDENT_AMBULATORY_CARE_PROVIDER_SITE_OTHER): Payer: No Typology Code available for payment source

## 2022-04-14 DIAGNOSIS — K2100 Gastro-esophageal reflux disease with esophagitis, without bleeding: Secondary | ICD-10-CM | POA: Diagnosis not present

## 2022-04-14 DIAGNOSIS — E782 Mixed hyperlipidemia: Secondary | ICD-10-CM | POA: Diagnosis not present

## 2022-04-14 DIAGNOSIS — M1A09X0 Idiopathic chronic gout, multiple sites, without tophus (tophi): Secondary | ICD-10-CM | POA: Diagnosis not present

## 2022-04-14 DIAGNOSIS — Z0000 Encounter for general adult medical examination without abnormal findings: Secondary | ICD-10-CM | POA: Diagnosis not present

## 2022-04-14 DIAGNOSIS — N400 Enlarged prostate without lower urinary tract symptoms: Secondary | ICD-10-CM

## 2022-04-14 LAB — COMPREHENSIVE METABOLIC PANEL
ALT: 9 U/L (ref 0–53)
AST: 12 U/L (ref 0–37)
Albumin: 4.1 g/dL (ref 3.5–5.2)
Alkaline Phosphatase: 73 U/L (ref 39–117)
BUN: 16 mg/dL (ref 6–23)
CO2: 33 mEq/L — ABNORMAL HIGH (ref 19–32)
Calcium: 9 mg/dL (ref 8.4–10.5)
Chloride: 104 mEq/L (ref 96–112)
Creatinine, Ser: 0.71 mg/dL (ref 0.40–1.50)
GFR: 93.77 mL/min (ref >60.00)
Glucose, Bld: 87 mg/dL (ref 70–99)
Potassium: 3.8 mEq/L (ref 3.5–5.1)
Sodium: 143 mEq/L (ref 135–145)
Total Bilirubin: 0.4 mg/dL (ref 0.2–1.2)
Total Protein: 6.8 g/dL (ref 6.0–8.3)

## 2022-04-14 LAB — CBC WITH DIFFERENTIAL/PLATELET
Basophils Absolute: 0 10*3/uL (ref 0.0–0.1)
Basophils Relative: 0.8 % (ref 0.0–3.0)
Eosinophils Absolute: 0.2 10*3/uL (ref 0.0–0.7)
Eosinophils Relative: 3.4 % (ref 0.0–5.0)
HCT: 37.8 % — ABNORMAL LOW (ref 39.0–52.0)
Hemoglobin: 13.1 g/dL (ref 13.0–17.0)
Lymphocytes Relative: 36.9 % (ref 12.0–46.0)
Lymphs Abs: 1.7 10*3/uL (ref 0.7–4.0)
MCHC: 34.6 g/dL (ref 30.0–36.0)
MCV: 83.6 fl (ref 78.0–100.0)
Monocytes Absolute: 0.4 10*3/uL (ref 0.1–1.0)
Monocytes Relative: 8.3 % (ref 3.0–12.0)
Neutro Abs: 2.4 10*3/uL (ref 1.4–7.7)
Neutrophils Relative %: 50.6 % (ref 43.0–77.0)
Platelets: 222 10*3/uL (ref 150.0–400.0)
RBC: 4.53 Mil/uL (ref 4.22–5.81)
RDW: 14.1 % (ref 11.5–15.5)
WBC: 4.7 10*3/uL (ref 4.0–10.5)

## 2022-04-14 LAB — LIPID PANEL
Cholesterol: 158 mg/dL (ref 0–200)
HDL: 35.4 mg/dL — ABNORMAL LOW (ref >39.00)
LDL Cholesterol: 92 mg/dL (ref 0–99)
NonHDL: 122.16
Total CHOL/HDL Ratio: 4
Triglycerides: 153 mg/dL — ABNORMAL HIGH (ref 0.0–149.0)
VLDL: 30.6 mg/dL (ref 0.0–40.0)

## 2022-04-14 LAB — TSH: TSH: 2.31 u[IU]/mL (ref 0.35–5.50)

## 2022-04-14 LAB — URIC ACID: Uric Acid, Serum: 6.1 mg/dL (ref 4.0–7.8)

## 2022-04-14 LAB — PSA: PSA: 3.06 ng/mL (ref 0.10–4.00)

## 2022-04-14 LAB — HEMOGLOBIN A1C: Hgb A1c MFr Bld: 5.4 % (ref 4.6–6.5)

## 2022-04-15 ENCOUNTER — Telehealth: Payer: Self-pay

## 2022-04-15 NOTE — Telephone Encounter (Signed)
Unsuccessful attempt to reach patient on preferred number listed in notes for scheduled AWV. Left message on voicemail okay to reschedule. 

## 2022-04-20 ENCOUNTER — Telehealth: Payer: Self-pay | Admitting: Adult Health

## 2022-04-20 NOTE — Telephone Encounter (Signed)
Left message for patient to call back and schedule Medicare Annual Wellness Visit (AWV) either virtually or in office. Left  my Herbie Drape number [phone]   Last AWV 12/02/20 please schedule with Nurse Health Adviser   45 min for awv-i and in office appointments 30 min for awv-s  phone/virtual appointments

## 2022-07-04 ENCOUNTER — Ambulatory Visit (INDEPENDENT_AMBULATORY_CARE_PROVIDER_SITE_OTHER): Payer: No Typology Code available for payment source | Admitting: Podiatry

## 2022-07-04 ENCOUNTER — Encounter: Payer: Self-pay | Admitting: Podiatry

## 2022-07-04 DIAGNOSIS — B351 Tinea unguium: Secondary | ICD-10-CM

## 2022-07-04 DIAGNOSIS — M79675 Pain in left toe(s): Secondary | ICD-10-CM

## 2022-07-04 DIAGNOSIS — M79674 Pain in right toe(s): Secondary | ICD-10-CM | POA: Diagnosis not present

## 2022-07-04 NOTE — Progress Notes (Signed)
This patient returns to the office for evaluation and treatment of long thick painful nails .  This patient is unable to trim his own nails since the patient cannot reach his feet.  Patient says the nails are painful walking and wearing his shoes.  He returns for preventive foot care services. He has not been seen in over 10 months.  General Appearance  Alert, conversant and in no acute stress.  Vascular  Dorsalis pedis and posterior tibial  pulses are palpable  bilaterally.  Capillary return is within normal limits  bilaterally. Temperature is within normal limits  bilaterally.  Neurologic  Senn-Weinstein monofilament wire test within normal limits  bilaterally. Muscle power within normal limits bilaterally.  Nails Thick disfigured discolored nails with subungual debris  from hallux to fifth toes bilaterally. No evidence of bacterial infection or drainage bilaterally.  Orthopedic  No limitations of motion  feet .  No crepitus or effusions noted.  No bony pathology or digital deformities noted.  HAV  B/L.  Skin  normotropic skin with no porokeratosis noted bilaterally.  No signs of infections or ulcers noted.     Onychomycosis  Pain in toes right foot  Pain in toes left foot  Debridement  of nails  1-5  B/L with a nail nipper.  Nails were then filed using a dremel tool with no incidents.    RTC prn   Gardiner Barefoot DPM

## 2022-07-06 ENCOUNTER — Ambulatory Visit: Payer: No Typology Code available for payment source

## 2022-07-06 VITALS — Ht 70.0 in | Wt 142.0 lb

## 2022-07-06 DIAGNOSIS — Z0000 Encounter for general adult medical examination without abnormal findings: Secondary | ICD-10-CM | POA: Diagnosis not present

## 2022-07-06 NOTE — Patient Instructions (Addendum)
[name] , Thank you for taking time to come for your Medicare Wellness Visit. I appreciate your ongoing commitment to your health goals. Please review the following plan we discussed and let me know if I can assist you in the future.   These are the goals we discussed:  Goals       Enhance My Mental and Physical Skills.      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   LCSW collaboration with Nurse Practitioner, [name], to report Wellbutrin XL 300 MG, PO, Daily, and Claritin, have proven to be ineffective, according to primary caregivers, Jenny Reichmann and [employer].  Contact LCSW directly (# [phone]) if you have questions, need assistance, or if additional social work needs are identified between now and our next scheduled telephone outreach call.       No current goals (pt-stated)      Patient Stated      None at this time        This is a list of the screening recommended for you and due dates:  Health Maintenance  Topic Date Due   DTaP/Tdap/Td vaccine (1 - Tdap) Never done   COVID-19 Vaccine (1) 07/22/2022*   Zoster (Shingles) Vaccine (1 of 2) 10/04/2022*   Colon Cancer Screening  07/07/2023*   Medicare Annual Wellness Visit  07/07/2023   Pneumonia Vaccine  Completed   Flu Shot  Completed   Hepatitis C Screening: USPSTF Recommendation to screen - Ages 18-79 yo.  Completed   HPV Vaccine  Aged Out  *Topic was postponed. The date shown is not the original due date.    Advanced directives: Please bring a copy of your health care power of attorney and living will to the office to be added to your chart at your convenience.   Conditions/risks identified: None  Next appointment: Follow up in one year for your  annual wellness visit.   Preventive Care 70 Years and Older, Male  Preventive care refers to lifestyle choices and visits with your health care provider that can promote health and wellness. What does preventive care include? A yearly physical exam. This is also called an annual well check. Dental exams once or twice a year. Routine eye exams. Ask your health care provider how often you should have your eyes checked. Personal lifestyle choices, including: Daily care of your teeth and gums. Regular physical activity. Eating a healthy diet. Avoiding tobacco and drug use. Limiting alcohol use. Practicing safe sex. Taking low doses of aspirin every day. Taking vitamin and mineral supplements as recommended by your health care provider. What happens during an annual well check? The services and screenings done by your health care provider during your annual well check will depend on your age, overall health, lifestyle risk factors, and family history of disease. Counseling  Your health care provider may ask you questions about your: Alcohol use. Tobacco use. Drug use. Emotional well-being. Home and relationship well-being. Sexual activity. Eating habits. History of falls. Memory and ability to understand (cognition). Work and work Statistician. Screening  You may have the following tests or measurements: Height, weight, and BMI. Blood pressure. Lipid and cholesterol levels.  These may be checked every 5 years, or more frequently if you are over 57 years old. Skin check. Lung cancer screening. You may have this screening every year starting at age 21 if you have a 30-pack-year history of smoking and currently smoke or have quit within the past 15 years. Fecal occult blood test (FOBT) of the stool. You may have this test every year starting at age 48. Flexible sigmoidoscopy or colonoscopy. You may have a sigmoidoscopy every 5 years or a colonoscopy every 10 years starting at age  33. Prostate cancer screening. Recommendations will vary depending on your family history and other risks. Hepatitis C blood test. Hepatitis B blood test. Sexually transmitted disease (STD) testing. Diabetes screening. This is done by checking your blood sugar (glucose) after you have not eaten for a while (fasting). You may have this done every 1-3 years. Abdominal aortic aneurysm (AAA) screening. You may need this if you are a current or former smoker. Osteoporosis. You may be screened starting at age 32 if you are at high risk. Talk with your health care provider about your test results, treatment options, and if necessary, the need for more tests. Vaccines  Your health care provider may recommend certain vaccines, such as: Influenza vaccine. This is recommended every year. Tetanus, diphtheria, and acellular pertussis (Tdap, Td) vaccine. You may need a Td booster every 10 years. Zoster vaccine. You may need this after age 73. Pneumococcal 13-valent conjugate (PCV13) vaccine. One dose is recommended after age 3. Pneumococcal polysaccharide (PPSV23) vaccine. One dose is recommended after age 51. Talk to your health care provider about which screenings and vaccines you need and how often you need them. This information is not intended to replace advice given to you by your health care provider. Make sure you discuss any questions you have with your health care provider. Document Released: 05/22/2015 Document Revised: 01/13/2016 Document Reviewed: 02/24/2015 Elsevier Interactive Patient Education  2017 [employer] Prevention in the Home Falls can cause injuries. They can happen to people of all ages. There are many things you can do to make your home safe and to help prevent falls. What can I do on the outside of my home? Regularly fix the edges of walkways and driveways and fix any cracks. Remove anything that might make you trip as you walk through a door, such as a raised step or  threshold. Trim any bushes or trees on the path to your home. Use bright outdoor lighting. Clear any walking paths of anything that might make someone trip, such as rocks or tools. Regularly check to see if handrails are loose or broken. Make sure that both sides of any steps have handrails. Any raised decks and porches should have guardrails on the edges. Have any leaves, snow, or ice cleared regularly. Use sand or salt on walking paths during winter. Clean up any spills in your garage right away. This includes oil or grease spills. What can I do in the bathroom? Use night lights. Install grab bars by the toilet and in the tub and shower. Do not use towel bars as grab bars. Use non-skid mats or decals in the tub or shower. If you need to sit down in the shower, use a plastic, non-slip stool. Keep the floor dry. Clean up any water that spills on the floor as soon as it happens. Remove soap buildup in the tub or shower regularly. Attach bath mats securely with double-sided non-slip rug tape. Do not have throw  rugs and other things on the floor that can make you trip. What can I do in the bedroom? Use night lights. Make sure that you have a light by your bed that is easy to reach. Do not use any sheets or blankets that are too big for your bed. They should not hang down onto the floor. Have a firm chair that has side arms. You can use this for support while you get dressed. Do not have throw rugs and other things on the floor that can make you trip. What can I do in the kitchen? Clean up any spills right away. Avoid walking on wet floors. Keep items that you use a lot in easy-to-reach places. If you need to reach something above you, use a strong step stool that has a grab bar. Keep electrical cords out of the way. Do not use floor polish or wax that makes floors slippery. If you must use wax, use non-skid floor wax. Do not have throw rugs and other things on the floor that can make you  trip. What can I do with my stairs? Do not leave any items on the stairs. Make sure that there are handrails on both sides of the stairs and use them. Fix handrails that are broken or loose. Make sure that handrails are as long as the stairways. Check any carpeting to make sure that it is firmly attached to the stairs. Fix any carpet that is loose or worn. Avoid having throw rugs at the top or bottom of the stairs. If you do have throw rugs, attach them to the floor with carpet tape. Make sure that you have a light switch at the top of the stairs and the bottom of the stairs. If you do not have them, ask someone to add them for you. What else can I do to help prevent falls? Wear shoes that: Do not have high heels. Have rubber bottoms. Are comfortable and fit you well. Are closed at the toe. Do not wear sandals. If you use a stepladder: Make sure that it is fully opened. Do not climb a closed stepladder. Make sure that both sides of the stepladder are locked into place. Ask someone to hold it for you, if possible. Clearly mark and make sure that you can see: Any grab bars or handrails. First and last steps. Where the edge of each step is. Use tools that help you move around (mobility aids) if they are needed. These include: Canes. Walkers. Scooters. Crutches. Turn on the lights when you go into a dark area. Replace any light bulbs as soon as they burn out. Set up your furniture so you have a clear path. Avoid moving your furniture around. If any of your floors are uneven, fix them. If there are any pets around you, be aware of where they are. Review your medicines with your doctor. Some medicines can make you feel dizzy. This can increase your chance of falling. Ask your doctor what other things that you can do to help prevent falls. This information is not intended to replace advice given to you by your health care provider. Make sure you discuss any questions you have with your  health care provider. Document Released: 02/19/2009 Document Revised: 10/01/2015 Document Reviewed: 05/30/2014 Elsevier Interactive Patient Education  2017 [employer].

## 2022-07-06 NOTE — Progress Notes (Signed)
Subjective:   Frank Rogers is a 71 y.o. male who presents for Medicare Annual/Subsequent preventive examination.  Review of Systems    Virtual Visit via Telephone Note  I connected with  Frank Rogers on 07/06/22 at  2:15 PM EST by telephone and verified that I am speaking with the correct person using two identifiers.  Location: Patient: Home Provider: Office Persons participating in the virtual visit: patient/Nurse Health Advisor   I discussed the limitations, risks, security and privacy concerns of performing an evaluation and management service by telephone and the availability of in person appointments. The patient expressed understanding and agreed to proceed.  Interactive audio and video telecommunications were attempted between this nurse and patient, however failed, due to patient having technical difficulties OR patient did not have access to video capability.  We continued and completed visit with audio only.  Some vital signs may be absent or patient reported.   Frank Peaches, LPN  Cardiac Risk Factors include: advanced age (>80mn, >>31women);male gender     Objective:    Today's Vitals   07/06/22 1410  Weight: 142 lb (64.4 kg)  Height: '5\' 10"'$  (1.778 m)   Body mass index is 20.37 kg/m.     07/06/2022    2:21 PM 04/15/2021   12:10 PM 12/02/2020   10:23 AM  Advanced Directives  Does Patient Have a Medical Advance Directive? Yes Yes Yes  Type of [place] Does patient want to make changes to medical advance directive?  No - Guardian declined   Copy of [place] Chart? No - copy requested Yes - validated most recent copy scanned in chart (See row information) No - copy requested    Current Medications (verified) Outpatient Encounter Medications as of 07/06/2022  Medication Sig   allopurinol (ZYLOPRIM) 300  MG tablet Take 1 tablet (300 mg total) by mouth daily.   Aromatic Inhalants (VICKS VAPOINHALER IN) Inhale into the lungs.   atorvastatin (LIPITOR) 10 MG tablet Take 1 tablet (10 mg total) by mouth daily.   buPROPion (WELLBUTRIN XL) 300 MG 24 hr tablet Take 1 tablet (300 mg total) by mouth daily.   citalopram (CELEXA) 10 MG tablet Take 1 tablet (10 mg total) by mouth daily.   finasteride (PROSCAR) 5 MG tablet TAKE AS DIRECTED   omeprazole (PRILOSEC) 40 MG capsule Take 1 capsule (40 mg total) by mouth daily.   OVER THE COUNTER MEDICATION OTC medication for infection in the foot (name unknown)   tamsulosin (FLOMAX) 0.4 MG CAPS capsule Take 1 capsule (0.4 mg total) by mouth daily.   No facility-administered encounter medications on file as of 07/06/2022.    Allergies (verified) Patient has no known allergies.   History: Past Medical History:  Diagnosis Date   Arthritis    Chicken pox    Migraines    Seasonal allergies    History reviewed. No pertinent surgical history. Family History  Problem Relation Age of Onset   Arthritis Mother    Hearing loss Mother    Stroke Mother    Diabetes Father    Hearing loss Father    Heart attack Sister    Hyperlipidemia Sister    Hypertension Sister    Social History   Socioeconomic History   Marital status: Single    Spouse name: Not on file   Number of children: 0   Years of  education: 10th   Highest education Rogers: 10th grade  Occupational History   Not on file  Tobacco Use   Smoking status: Some Days    Types: Pipe, Cigars    Passive exposure: Never   Smokeless tobacco: Current  Vaping Use   Vaping Use: Never used  Substance and Sexual Activity   Alcohol use: Not Currently    Alcohol/week: 4.0 standard drinks of alcohol    Types: 4 Cans of beer per week   Drug use: No   Sexual activity: Not Currently  Other Topics Concern   Not on file  Social History Narrative   He is retired    Not married    Social Determinants of  Health   Financial Resource Strain: Low Risk  (07/06/2022)   Overall Financial Resource Strain (CARDIA)    Difficulty of Paying Living Expenses: Not hard at all  Food Insecurity: No Food Insecurity (07/06/2022)   Hunger Vital Sign    Worried About Running Out of Food in the Last Year: Never true    Ran Out of Food in the Last Year: Never true  Transportation Needs: No Transportation Needs (07/06/2022)   PRAPARE - Hydrologist (Medical): No    Lack of Transportation (Non-Medical): No  Physical Activity: Inactive (07/06/2022)   Exercise Vital Sign    Days of Exercise per Week: 0 days    Minutes of Exercise per Session: 0 min  Stress: No Stress Concern Present (07/06/2022)   [place]    Feeling of Stress : Not at all  Social Connections: Moderately Integrated (07/06/2022)   Social Connection and Isolation Panel [NHANES]    Frequency of Communication with Friends and Family: More than three times a week    Frequency of Social Gatherings with Friends and Family: More than three times a week    Attends Religious Services: More than 4 times per year    Active Member of [employer] or Organizations: Yes    Attends Music therapist: More than 4 times per year    Marital Status: Never married    Tobacco Counseling Ready to quit: No Counseling given: Yes   Clinical Intake:  Pre-visit preparation completed: No  Pain : No/denies pain     BMI - recorded: 20.37 Nutritional Status: BMI of 19-24  Normal Nutritional Risks: None Diabetes: No  How often do you need to have someone help you when you read instructions, pamphlets, or other written materials from your doctor or pharmacy?: 5 - Always (Aide assist)  Diabetic?  No  Interpreter Needed?: No  Information entered by :: Rolene Arbour LPN   Activities of Daily Living    07/06/2022    2:19 PM  In your present state of health,  do you have any difficulty performing the following activities:  Hearing? 0  Vision? 0  Difficulty concentrating or making decisions? 0  Walking or climbing stairs? 0  Dressing or bathing? 0  Doing errands, shopping? 0  Preparing Food and eating ? N  Using the Toilet? N  In the past six months, have you accidently leaked urine? N  Do you have problems with loss of bowel control? N  Managing your Medications? N  Managing your Finances? N  Housekeeping or managing your Housekeeping? N    Patient Care Team: [name], NP as PCP - General (Family Medicine) Irine Seal, MD as Attending Physician (Urology)  Indicate any recent Medical  Services you may have received from other than [facility] providers in the past year (date may be approximate).     Assessment:   This is a routine wellness examination for [place].  Hearing/Vision screen Hearing Screening - Comments:: Denies hearing difficulties   Vision Screening - Comments:: No vision problems. Patient deferred  Dietary issues and exercise activities discussed: Exercise limited by: None identified   Goals Addressed               This Visit's Progress     No current goals (pt-stated)         Depression Screen    07/06/2022    2:18 PM 04/15/2021   12:03 PM 04/15/2021   12:02 PM 12/02/2020   10:24 AM 09/08/2020   10:08 AM 04/14/2017   10:05 AM  PHQ 2/9 Scores  PHQ - 2 Score 0  0 0 0 0  Exception Documentation  Medical reason      Not completed  Verified by Caregiver - Sherrie Coltrane        Fall Risk    07/06/2022    2:20 PM 04/15/2021   12:07 PM 12/02/2020   10:24 AM 09/08/2020   10:08 AM 04/14/2017   10:05 AM  Fall Risk   Falls in the past year? 1 0 0 0 No  Number falls in past yr: 0 0 0 0   Injury with Fall? 0 0 0    Comment No injury or medical attention needeed      Risk for fall due to : No Fall Risks No Fall Risks     Follow up Falls prevention discussed Falls evaluation completed;Education provided;Falls  prevention discussed Falls prevention discussed      FALL RISK PREVENTION PERTAINING TO THE HOME:  Any stairs in or around the home? Yes  If so, are there any without handrails? No  Home free of loose throw rugs in walkways, pet beds, electrical cords, etc? Yes  Adequate lighting in your home to reduce risk of falls? Yes   ASSISTIVE DEVICES UTILIZED TO PREVENT FALLS:  Life alert? No  Use of a cane, walker or w/c? No  Grab bars in the bathroom? No  Shower chair or bench in shower? Yes  Elevated toilet seat or a handicapped toilet? No   TIMED UP AND GO:  Was the test performed? No . Audio Visit   Cognitive Function:        07/06/2022    2:21 PM  6CIT Screen  What Year? 4 points  What month? 0 points  What time? 0 points  Count back from 20 0 points  Months in reverse 4 points  Repeat phrase 8 points  Total Score 16 points    Immunizations Immunization History  Administered Date(s) Administered   Fluad Quad(high Dose 65+) 03/28/2019, 03/15/2022   Influenza,inj,Quad PF,6+ Mos 02/03/2017   PNEUMOCOCCAL CONJUGATE-20 09/08/2020    TDAP status: Due, Education has been provided regarding the importance of this vaccine. Advised may receive this vaccine at local pharmacy or Health Dept. Aware to provide a copy of the vaccination record if obtained from local pharmacy or Health Dept. Verbalized acceptance and understanding.  Flu Vaccine status: Up to date  Pneumococcal vaccine status: Up to date  Covid-19 vaccine status: Declined, Education has been provided regarding the importance of this vaccine but patient still declined. Advised may receive this vaccine at local pharmacy or Health Dept.or vaccine clinic. Aware to provide a copy of the vaccination record if obtained  from local pharmacy or Health Dept. Verbalized acceptance and understanding.  Qualifies for Shingles Vaccine? Yes   Zostavax completed No   Shingrix Completed?: No.    Education has been provided regarding  the importance of this vaccine. Patient has been advised to call insurance company to determine out of pocket expense if they have not yet received this vaccine. Advised may also receive vaccine at local pharmacy or Health Dept. Verbalized acceptance and understanding.  Screening Tests Health Maintenance  Topic Date Due   DTaP/Tdap/Td (1 - Tdap) Never done   COVID-19 Vaccine (1) 07/22/2022 (Originally 07/07/1951)   Zoster Vaccines- Shingrix (1 of 2) 10/04/2022 (Originally 01/08/2003)   COLONOSCOPY (Pts 45-51yr Insurance coverage will need to be confirmed)  07/07/2023 (Originally 01/07/1998)   Medicare Annual Wellness (AWV)  07/07/2023   Pneumonia Vaccine 70 Years old  Completed   INFLUENZA VACCINE  Completed   Hepatitis C Screening  Completed   HPV VACCINES  Aged Out    Health Maintenance  Health Maintenance Due  Topic Date Due   DTaP/Tdap/Td (1 - Tdap) Never done    Patient deferred  Lung Cancer Screening: (Low Dose CT Chest recommended if Age 70-80years, 30 pack-year currently smoking OR have quit w/in 15years.) does not qualify.     Additional Screening:  Hepatitis C Screening: does qualify; Completed 04/14/17  Vision Screening: Recommended annual ophthalmology exams for early detection of glaucoma and other disorders of the eye. Is the patient up to date with their annual eye exam?  No Who is the provider or what is the name of the office in which the patient attends annual eye exams? Patient deferred If pt is not established with a provider, would they like to be referred to a provider to establish care? No .   Dental Screening: Recommended annual dental exams for proper oral hygiene  Community Resource Referral / Chronic Care Management:  CRR required this visit?  No   CCM required this visit?  No      Plan:     I have personally reviewed and noted the following in the patient's chart:   Medical and social history Use of alcohol, tobacco or illicit drugs   Current medications and supplements including opioid prescriptions. Patient is not currently taking opioid prescriptions. Functional ability and status Nutritional status Physical activity Advanced directives List of other physicians Hospitalizations, surgeries, and ER visits in previous 12 months Vitals Screenings to include cognitive, depression, and falls Referrals and appointments  In addition, I have reviewed and discussed with patient certain preventive protocols, quality metrics, and best practice recommendations. A written personalized care plan for preventive services as well as general preventive health recommendations were provided to patient.     BCriselda Peaches LPN   [id number]  Nurse Notes: None

## 2022-07-11 ENCOUNTER — Other Ambulatory Visit: Payer: Self-pay | Admitting: Adult Health

## 2022-07-26 ENCOUNTER — Other Ambulatory Visit: Payer: Self-pay | Admitting: Adult Health

## 2022-10-10 ENCOUNTER — Ambulatory Visit (INDEPENDENT_AMBULATORY_CARE_PROVIDER_SITE_OTHER): Payer: No Typology Code available for payment source | Admitting: Podiatry

## 2022-10-10 ENCOUNTER — Encounter: Payer: Self-pay | Admitting: Podiatry

## 2022-10-10 DIAGNOSIS — M79674 Pain in right toe(s): Secondary | ICD-10-CM | POA: Diagnosis not present

## 2022-10-10 DIAGNOSIS — M79675 Pain in left toe(s): Secondary | ICD-10-CM

## 2022-10-10 DIAGNOSIS — B351 Tinea unguium: Secondary | ICD-10-CM

## 2022-10-10 NOTE — Progress Notes (Signed)
This patient returns to the office for evaluation and treatment of long thick painful nails .  This patient is unable to trim his own nails since the patient cannot reach his feet.  Patient says the nails are painful walking and wearing his shoes.  He returns for preventive foot care services.  General Appearance  Alert, conversant and in no acute stress.  Vascular  Dorsalis pedis and posterior tibial  pulses are palpable  bilaterally.  Capillary return is within normal limits  bilaterally. Temperature is within normal limits  bilaterally.  Neurologic  Senn-Weinstein monofilament wire test within normal limits  bilaterally. Muscle power within normal limits bilaterally.  Nails Thick disfigured discolored nails with subungual debris  from hallux to fifth toes bilaterally. No evidence of bacterial infection or drainage bilaterally.  Orthopedic  No limitations of motion  feet .  No crepitus or effusions noted.  No bony pathology or digital deformities noted.  HAV  B/L.  Skin  normotropic skin with no porokeratosis noted bilaterally.  No signs of infections or ulcers noted.     Onychomycosis  Pain in toes right foot  Pain in toes left foot  Debridement  of nails  1-5  B/L with a nail nipper.  Nails were then filed using a dremel tool with no incidents.    RTC  3 months   Deronda Christian DPM  

## 2022-11-08 ENCOUNTER — Other Ambulatory Visit: Payer: Self-pay | Admitting: Adult Health

## 2022-11-08 DIAGNOSIS — M1A09X0 Idiopathic chronic gout, multiple sites, without tophus (tophi): Secondary | ICD-10-CM

## 2023-01-10 ENCOUNTER — Ambulatory Visit: Payer: No Typology Code available for payment source | Admitting: Podiatry

## 2023-02-08 ENCOUNTER — Other Ambulatory Visit: Payer: Self-pay | Admitting: Adult Health

## 2023-02-10 ENCOUNTER — Other Ambulatory Visit: Payer: Self-pay | Admitting: Adult Health

## 2023-02-10 DIAGNOSIS — Z1212 Encounter for screening for malignant neoplasm of rectum: Secondary | ICD-10-CM

## 2023-02-10 DIAGNOSIS — Z1211 Encounter for screening for malignant neoplasm of colon: Secondary | ICD-10-CM

## 2023-05-16 ENCOUNTER — Other Ambulatory Visit: Payer: Self-pay | Admitting: Adult Health

## 2023-05-16 DIAGNOSIS — M1A09X0 Idiopathic chronic gout, multiple sites, without tophus (tophi): Secondary | ICD-10-CM

## 2023-05-16 DIAGNOSIS — N400 Enlarged prostate without lower urinary tract symptoms: Secondary | ICD-10-CM

## 2023-05-16 NOTE — Telephone Encounter (Signed)
 Patient need to schedule for more refills.

## 2023-07-19 ENCOUNTER — Telehealth: Payer: Self-pay

## 2023-07-19 NOTE — Telephone Encounter (Signed)
 Unsuccessful attempts to reach patient on preferred number listed in notes for scheduled AWV. Left message on voicemail okay to reschedule.

## 2023-10-11 ENCOUNTER — Ambulatory Visit (INDEPENDENT_AMBULATORY_CARE_PROVIDER_SITE_OTHER): Admitting: Adult Health

## 2023-10-11 ENCOUNTER — Ambulatory Visit: Admitting: Adult Health

## 2023-10-11 ENCOUNTER — Encounter: Payer: Self-pay | Admitting: Adult Health

## 2023-10-11 VITALS — BP 130/80 | HR 62 | Temp 97.9°F | Ht 70.0 in | Wt 152.0 lb

## 2023-10-11 DIAGNOSIS — K2100 Gastro-esophageal reflux disease with esophagitis, without bleeding: Secondary | ICD-10-CM

## 2023-10-11 DIAGNOSIS — Z0000 Encounter for general adult medical examination without abnormal findings: Secondary | ICD-10-CM | POA: Diagnosis not present

## 2023-10-11 DIAGNOSIS — M1A09X0 Idiopathic chronic gout, multiple sites, without tophus (tophi): Secondary | ICD-10-CM | POA: Diagnosis not present

## 2023-10-11 DIAGNOSIS — N400 Enlarged prostate without lower urinary tract symptoms: Secondary | ICD-10-CM | POA: Diagnosis not present

## 2023-10-11 DIAGNOSIS — Z1211 Encounter for screening for malignant neoplasm of colon: Secondary | ICD-10-CM | POA: Diagnosis not present

## 2023-10-11 DIAGNOSIS — F39 Unspecified mood [affective] disorder: Secondary | ICD-10-CM | POA: Diagnosis not present

## 2023-10-11 DIAGNOSIS — E782 Mixed hyperlipidemia: Secondary | ICD-10-CM

## 2023-10-11 MED ORDER — FINASTERIDE 5 MG PO TABS
ORAL_TABLET | ORAL | 3 refills | Status: AC
Start: 2023-10-11 — End: ?

## 2023-10-11 MED ORDER — ALLOPURINOL 300 MG PO TABS
300.0000 mg | ORAL_TABLET | Freq: Every day | ORAL | 3 refills | Status: AC
Start: 2023-10-11 — End: ?

## 2023-10-11 MED ORDER — CITALOPRAM HYDROBROMIDE 10 MG PO TABS: 10.0000 mg | ORAL_TABLET | Freq: Every day | ORAL | 3 refills | Status: DC

## 2023-10-11 MED ORDER — BUPROPION HCL ER (XL) 300 MG PO TB24
300.0000 mg | ORAL_TABLET | Freq: Every day | ORAL | 3 refills | Status: AC
Start: 2023-10-11 — End: ?

## 2023-10-11 MED ORDER — ATORVASTATIN CALCIUM 10 MG PO TABS
10.0000 mg | ORAL_TABLET | Freq: Every day | ORAL | 3 refills | Status: AC
Start: 2023-10-11 — End: ?

## 2023-10-11 MED ORDER — OMEPRAZOLE 40 MG PO CPDR: 40.0000 mg | DELAYED_RELEASE_CAPSULE | Freq: Every day | ORAL | 3 refills | Status: AC

## 2023-10-11 MED ORDER — TAMSULOSIN HCL 0.4 MG PO CAPS
0.4000 mg | ORAL_CAPSULE | Freq: Every day | ORAL | 3 refills | Status: AC
Start: 2023-10-11 — End: ?

## 2023-10-11 NOTE — Progress Notes (Signed)
 Subjective:    Patient ID: Frank Rogers, male    DOB: 1950/05/24, 73 y.o.   MRN: [id number]  HPI Patient presents for yearly preventative medicine examination. She is a pleasant 73 year old male who  has a past medical history of Arthritis, Chicken pox, Migraines, and Seasonal allergies.  History of gout-currently managed with allopurinol  300 mg daily.  He does try and refrain from foods and drinks that could cause gout flares.  He has not had any gout flare in quite some time  Hyperlipidemia-managed with Lipitor 10 mg daily.  He denies myalgia or fatigue Lab Results  Component Value Date   CHOL 158 04/14/2022   HDL 35.40 (L) 04/14/2022   LDLCALC 92 04/14/2022   TRIG 153.0 (H) 04/14/2022   CHOLHDL 4 04/14/2022   BPH-managed with Flomax  0.4 mg daily and Proscar  5 mg daily.  Denies symptoms  Mood disorder-well-controlled with Wellbutrin  300 mg extended release and Celexa  10 mg daily.  GERD-managed with Prilosec 20 mg daily.  He does report intermittent flares of acid reflux.  He does not eat acidic foods or fast foods.  All immunizations and health maintenance protocols were reviewed with the patient and needed orders were placed.  Appropriate screening laboratory values were ordered for the patient including screening of hyperlipidemia, renal function and hepatic function. If indicated by BPH, a PSA was ordered.  Medication reconciliation,  past medical history, social history, problem list and allergies were reviewed in detail with the patient  Goals were established with regard to weight loss, exercise, and  diet in compliance with medications Wt Readings from Last 3 Encounters:  10/11/23 152 lb (68.9 kg)  07/06/22 142 lb (64.4 kg)  03/15/22 142 lb (64.4 kg)   He never completed his cologuard that was sent in last year   He has no acute issues    Review of Systems  Constitutional: Negative.   HENT: Negative.    Eyes: Negative.   Respiratory: Negative.     Cardiovascular: Negative.   Gastrointestinal: Negative.   Endocrine: Negative.   Genitourinary: Negative.   Musculoskeletal: Negative.   Skin: Negative.   Allergic/Immunologic: Negative.   Neurological: Negative.   Hematological: Negative.   Psychiatric/Behavioral: Negative.     Past Medical History:  Diagnosis Date   Arthritis    Chicken pox    Migraines    Seasonal allergies     Social History   Socioeconomic History   Marital status: Single    Spouse name: Not on file   Number of children: 0   Years of education: 10th   Highest education level: 10th grade  Occupational History   Not on file  Tobacco Use   Smoking status: Some Days    Types: Pipe, Cigars    Passive exposure: Never   Smokeless tobacco: Current  Vaping Use   Vaping status: Never Used  Substance and Sexual Activity   Alcohol use: Not Currently    Alcohol/week: 2.0 standard drinks of alcohol    Types: 2 Cans of beer per week    Comment: occasionally   Drug use: No   Sexual activity: Not Currently  Other Topics Concern   Not on file  Social History Narrative   He is retired    Not married    Social Drivers of Corporate investment banker Strain: Low Risk  (07/06/2022)   Overall Financial Resource Strain (CARDIA)    Difficulty of Paying Living Expenses: Not hard at all  Food  Insecurity: No Food Insecurity (07/06/2022)   Hunger Vital Sign    Worried About Running Out of Food in the Last Year: Never true    Ran Out of Food in the Last Year: Never true  Transportation Needs: No Transportation Needs (07/06/2022)   PRAPARE - Administrator, Civil Service (Medical): No    Lack of Transportation (Non-Medical): No  Physical Activity: Inactive (07/06/2022)   Exercise Vital Sign    Days of Exercise per Week: 0 days    Minutes of Exercise per Session: 0 min  Stress: No Stress Concern Present (07/06/2022)   [employer] of Occupational Health - Occupational Stress Questionnaire     Feeling of Stress : Not at all  Social Connections: Moderately Integrated (07/06/2022)   Social Connection and Isolation Panel [NHANES]    Frequency of Communication with Friends and Family: More than three times a week    Frequency of Social Gatherings with Friends and Family: More than three times a week    Attends Religious Services: More than 4 times per year    Active Member of [employer] or Organizations: Yes    Attends Banker Meetings: More than 4 times per year    Marital Status: Never married  Intimate Partner Violence: Not At Risk (07/06/2022)   Humiliation, Afraid, Rape, and Kick questionnaire    Fear of Current or Ex-Partner: No    Emotionally Abused: No    Physically Abused: No    Sexually Abused: No    History reviewed. No pertinent surgical history.  Family History  Problem Relation Age of Onset   Arthritis Mother    Hearing loss Mother    Stroke Mother    Diabetes Father    Hearing loss Father    Heart attack Sister    Hyperlipidemia Sister    Hypertension Sister     No Known Allergies  Current Outpatient Medications on File Prior to Visit  Medication Sig Dispense Refill   Aromatic Inhalants (VICKS VAPOINHALER IN) Inhale into the lungs.     omeprazole  (PRILOSEC) 20 MG capsule TAKE 1 CAPSULE BY MOUTH EVERY DAY 90 capsule 1   OVER THE COUNTER MEDICATION OTC medication for infection in the foot (name unknown)     No current facility-administered medications on file prior to visit.    BP 130/80   Pulse 62   Temp 97.9 F (36.6 C) (Oral)   Ht 5\' 10"  (1.778 m)   Wt 152 lb (68.9 kg)   SpO2 97%   BMI 21.81 kg/m       Objective:   Physical Exam Vitals and nursing note reviewed.  Constitutional:      General: He is not in acute distress.    Appearance: Normal appearance. He is not ill-appearing.  HENT:     Head: Normocephalic and atraumatic.     Right Ear: Tympanic membrane, ear canal and external ear normal. There is no impacted cerumen.      Left Ear: Tympanic membrane, ear canal and external ear normal. There is no impacted cerumen.     Nose: Nose normal. No congestion or rhinorrhea.     Mouth/Throat:     Mouth: Mucous membranes are moist.     Dentition: Abnormal dentition.     Tongue: No lesions.     Pharynx: Oropharynx is clear.  Eyes:     Extraocular Movements: Extraocular movements intact.     Conjunctiva/sclera: Conjunctivae normal.     Pupils: Pupils are equal, round,  and reactive to light.  Neck:     Vascular: No carotid bruit.  Cardiovascular:     Rate and Rhythm: Normal rate and regular rhythm.     Pulses: Normal pulses.     Heart sounds: No murmur heard.    No friction rub. No gallop.  Pulmonary:     Effort: Pulmonary effort is normal.     Breath sounds: Normal breath sounds.  Abdominal:     General: Abdomen is flat. Bowel sounds are normal. There is no distension.     Palpations: Abdomen is soft. There is no mass.     Tenderness: There is no abdominal tenderness. There is no guarding or rebound.     Hernia: No hernia is present.  Musculoskeletal:        General: Normal range of motion.     Cervical back: Normal range of motion and neck supple.  Lymphadenopathy:     Cervical: No cervical adenopathy.  Skin:    General: Skin is warm and dry.     Capillary Refill: Capillary refill takes less than 2 seconds.  Neurological:     General: No focal deficit present.     Mental Status: He is alert and oriented to person, place, and time.  Psychiatric:        Mood and Affect: Mood normal.        Behavior: Behavior normal.        Thought Content: Thought content normal.        Judgment: Judgment normal.       Assessment & Plan:  1. Routine general medical examination at a health care facility (Primary) Today patient counseled on age appropriate routine health concerns for screening and prevention, each reviewed and up to date or declined. Immunizations reviewed and up to date or declined. Labs ordered and  reviewed. Risk factors for depression reviewed and negative. Hearing function and visual acuity are intact. ADLs screened and addressed as needed. Functional ability and level of safety reviewed and appropriate. Education, counseling and referrals performed based on assessed risks today. Patient provided with a copy of personalized plan for preventive services. - Encouraged to get shingles and tdap at pharmacy  - Eat healthy and stay active  - Follow up in one year or sooner if needed   2. Chronic gout of multiple sites, unspecified cause - Controlled. No recent flares - allopurinol  (ZYLOPRIM ) 300 MG tablet; Take 1 tablet (300 mg total) by mouth daily.  Dispense: 90 tablet; Refill: 3  3. Mixed hyperlipidemia - Continue with Lipitor 10 mg daily.  - atorvastatin  (LIPITOR) 10 MG tablet; Take 1 tablet (10 mg total) by mouth daily.  Dispense: 90 tablet; Refill: 3 - Lipid panel; Future - TSH; Future - CBC; Future - Comprehensive metabolic panel with GFR; Future - PSA; Future  4. Mood disorder (HCC) - Controlled. No change in medication  - buPROPion  (WELLBUTRIN  XL) 300 MG 24 hr tablet; Take 1 tablet (300 mg total) by mouth daily.  Dispense: 90 tablet; Refill: 3 - citalopram  (CELEXA ) 10 MG tablet; Take 1 tablet (10 mg total) by mouth daily.  Dispense: 90 tablet; Refill: 3 - Lipid panel; Future - TSH; Future - CBC; Future - Comprehensive metabolic panel with GFR; Future - PSA; Future  5. Benign prostatic hyperplasia without lower urinary tract symptoms - Well controlled. No change in medication  - finasteride  (PROSCAR ) 5 MG tablet; TAKE AS DIRECTED  Dispense: 90 tablet; Refill: 3 - tamsulosin  (FLOMAX ) 0.4 MG CAPS capsule; Take  1 capsule (0.4 mg total) by mouth daily.  Dispense: 90 capsule; Refill: 3 - Lipid panel; Future - TSH; Future - CBC; Future - Comprehensive metabolic panel with GFR; Future - PSA; Future  6. Colon cancer screening  - Cologuard  7. Gastroesophageal reflux  disease with esophagitis without hemorrhage - Will increase Prilosec to 40 mg daily  - omeprazole  (PRILOSEC) 40 MG capsule; Take 1 capsule (40 mg total) by mouth daily.  Dispense: 90 capsule; Refill: 3 - Lipid panel; Future - TSH; Future - CBC; Future - Comprehensive metabolic panel with GFR; Future - PSA; Future

## 2023-10-11 NOTE — Patient Instructions (Addendum)
 It was great seeing you today   We will follow up with you regarding your lab work   Please let me know if you need anything   You are do for your Tetanus booster and shingles vaccinations get these at a pharmacy    I have sent in all of your medications   I will see you back next year

## 2023-10-11 NOTE — Progress Notes (Deleted)
 Subjective:    Patient ID: Frank Rogers, male    DOB: April 10, 1951, 73 y.o.   MRN: [id number]  HPI Patient presents for yearly preventative medicine examination. He is a pleasant 73 year old male who  has a past medical history of Arthritis, Chicken pox, Migraines, and Seasonal allergies.  History of gout-currently managed with allopurinol  300 mg daily.  He does try and refrain from foods and drinks that could cause gout flares.  He has not had any gout flare in quite some time  Hyperlipidemia-managed with Lipitor 10 mg daily.  He denies myalgia or fatigue Lab Results  Component Value Date   CHOL 158 04/14/2022   HDL 35.40 (L) 04/14/2022   LDLCALC 92 04/14/2022   TRIG 153.0 (H) 04/14/2022   CHOLHDL 4 04/14/2022    BPH-managed with Flomax  0.4 mg daily and Proscar  5 mg daily.  Denies symptoms  Mood disorder-well-controlled with Wellbutrin  300 mg extended release and Celexa  10 mg daily.  GERD-managed with Prilosec 20 mg daily.  He does report intermittent flares of acid reflux.  He does not eat acidic foods or fast foods.     All immunizations and health maintenance protocols were reviewed with the patient and needed orders were placed.  Appropriate screening laboratory values were ordered for the patient including screening of hyperlipidemia, renal function and hepatic function. If indicated by BPH, a PSA was ordered.  Medication reconciliation,  past medical history, social history, problem list and allergies were reviewed in detail with the patient  Goals were established with regard to weight loss, exercise, and  diet in compliance with medications   Review of Systems  Constitutional: Negative.   HENT: Negative.    Eyes: Negative.   Respiratory: Negative.    Cardiovascular: Negative.   Gastrointestinal: Negative.   Endocrine: Negative.   Genitourinary: Negative.   Musculoskeletal: Negative.   Skin: Negative.   Allergic/Immunologic: Negative.   Neurological:  Negative.   Hematological: Negative.   Psychiatric/Behavioral: Negative.    All other systems reviewed and are negative.  Past Medical History:  Diagnosis Date   Arthritis    Chicken pox    Migraines    Seasonal allergies     Social History   Socioeconomic History   Marital status: Single    Spouse name: Not on file   Number of children: 0   Years of education: 10th   Highest education level: 10th grade  Occupational History   Not on file  Tobacco Use   Smoking status: Some Days    Types: Pipe, Cigars    Passive exposure: Never   Smokeless tobacco: Current  Vaping Use   Vaping status: Never Used  Substance and Sexual Activity   Alcohol use: Not Currently    Alcohol/week: 4.0 standard drinks of alcohol    Types: 4 Cans of beer per week   Drug use: No   Sexual activity: Not Currently  Other Topics Concern   Not on file  Social History Narrative   He is retired    Not married    Social Drivers of Corporate investment banker Strain: Low Risk  (07/06/2022)   Overall Financial Resource Strain (CARDIA)    Difficulty of Paying Living Expenses: Not hard at all  Food Insecurity: No Food Insecurity (07/06/2022)   Hunger Vital Sign    Worried About Running Out of Food in the Last Year: Never true    Ran Out of Food in the Last Year: Never true  Transportation Needs: No Transportation Needs (07/06/2022)   PRAPARE - Administrator, Civil Service (Medical): No    Lack of Transportation (Non-Medical): No  Physical Activity: Inactive (07/06/2022)   Exercise Vital Sign    Days of Exercise per Week: 0 days    Minutes of Exercise per Session: 0 min  Stress: No Stress Concern Present (07/06/2022)   [employer] of Occupational Health - Occupational Stress Questionnaire    Feeling of Stress : Not at all  Social Connections: Moderately Integrated (07/06/2022)   Social Connection and Isolation Panel [NHANES]    Frequency of Communication with Friends and Family: More  than three times a week    Frequency of Social Gatherings with Friends and Family: More than three times a week    Attends Religious Services: More than 4 times per year    Active Member of [employer] or Organizations: Yes    Attends Banker Meetings: More than 4 times per year    Marital Status: Never married  Intimate Partner Violence: Not At Risk (07/06/2022)   Humiliation, Afraid, Rape, and Kick questionnaire    Fear of Current or Ex-Partner: No    Emotionally Abused: No    Physically Abused: No    Sexually Abused: No    No past surgical history on file.  Family History  Problem Relation Age of Onset   Arthritis Mother    Hearing loss Mother    Stroke Mother    Diabetes Father    Hearing loss Father    Heart attack Sister    Hyperlipidemia Sister    Hypertension Sister     No Known Allergies  Current Outpatient Medications on File Prior to Visit  Medication Sig Dispense Refill   allopurinol  (ZYLOPRIM ) 300 MG tablet TAKE 1 TABLET BY MOUTH EVERY DAY 90 tablet 1   Aromatic Inhalants (VICKS VAPOINHALER IN) Inhale into the lungs.     atorvastatin  (LIPITOR) 10 MG tablet Take 1 tablet (10 mg total) by mouth daily. 90 tablet 3   buPROPion  (WELLBUTRIN  XL) 300 MG 24 hr tablet Take 1 tablet (300 mg total) by mouth daily. 90 tablet 3   citalopram  (CELEXA ) 10 MG tablet Take 1 tablet (10 mg total) by mouth daily. 90 tablet 1   finasteride  (PROSCAR ) 5 MG tablet TAKE AS DIRECTED 90 tablet 3   omeprazole  (PRILOSEC) 20 MG capsule TAKE 1 CAPSULE BY MOUTH EVERY DAY 90 capsule 1   omeprazole  (PRILOSEC) 40 MG capsule Take 1 capsule (40 mg total) by mouth daily. 90 capsule 3   OVER THE COUNTER MEDICATION OTC medication for infection in the foot (name unknown)     tamsulosin  (FLOMAX ) 0.4 MG CAPS capsule Take 1 capsule (0.4 mg total) by mouth daily. 90 capsule 3   No current facility-administered medications on file prior to visit.    There were no vitals taken for this visit.       Objective:   Physical Exam Vitals and nursing note reviewed.  Constitutional:      General: He is not in acute distress.    Appearance: Normal appearance. He is not ill-appearing.  HENT:     Head: Normocephalic and atraumatic.     Right Ear: Tympanic membrane, ear canal and external ear normal. There is no impacted cerumen.     Left Ear: Tympanic membrane, ear canal and external ear normal. There is no impacted cerumen.     Nose: Nose normal. No congestion or rhinorrhea.  Mouth/Throat:     Mouth: Mucous membranes are moist.     Pharynx: Oropharynx is clear.  Eyes:     Extraocular Movements: Extraocular movements intact.     Conjunctiva/sclera: Conjunctivae normal.     Pupils: Pupils are equal, round, and reactive to light.  Neck:     Vascular: No carotid bruit.  Cardiovascular:     Rate and Rhythm: Normal rate and regular rhythm.     Pulses: Normal pulses.     Heart sounds: No murmur heard.    No friction rub. No gallop.  Pulmonary:     Effort: Pulmonary effort is normal.     Breath sounds: Normal breath sounds.  Abdominal:     General: Abdomen is flat. Bowel sounds are normal. There is no distension.     Palpations: Abdomen is soft. There is no mass.     Tenderness: There is no abdominal tenderness. There is no guarding or rebound.     Hernia: No hernia is present.  Musculoskeletal:        General: Normal range of motion.     Cervical back: Normal range of motion and neck supple.  Lymphadenopathy:     Cervical: No cervical adenopathy.  Skin:    General: Skin is warm and dry.     Capillary Refill: Capillary refill takes less than 2 seconds.  Neurological:     General: No focal deficit present.     Mental Status: He is alert and oriented to person, place, and time.  Psychiatric:        Mood and Affect: Mood normal.        Behavior: Behavior normal.        Thought Content: Thought content normal.        Judgment: Judgment normal.        Assessment & Plan:

## 2023-10-12 ENCOUNTER — Ambulatory Visit: Payer: Self-pay | Admitting: Adult Health

## 2023-10-12 LAB — CBC
HCT: 40.7 % (ref 39.0–52.0)
Hemoglobin: 14.2 g/dL (ref 13.0–17.0)
MCHC: 34.9 g/dL (ref 30.0–36.0)
MCV: 83.4 fl (ref 78.0–100.0)
Platelets: 200 10*3/uL (ref 150.0–400.0)
RBC: 4.88 Mil/uL (ref 4.22–5.81)
RDW: 13.2 % (ref 11.5–15.5)
WBC: 3.8 10*3/uL — ABNORMAL LOW (ref 4.0–10.5)

## 2023-10-12 LAB — COMPREHENSIVE METABOLIC PANEL WITH GFR
ALT: 9 U/L (ref 0–53)
AST: 12 U/L (ref 0–37)
Albumin: 4.5 g/dL (ref 3.5–5.2)
Alkaline Phosphatase: 84 U/L (ref 39–117)
BUN: 18 mg/dL (ref 6–23)
CO2: 26 meq/L (ref 19–32)
Calcium: 9.4 mg/dL (ref 8.4–10.5)
Chloride: 101 meq/L (ref 96–112)
Creatinine, Ser: 0.91 mg/dL (ref 0.40–1.50)
GFR: 85.24 mL/min (ref >60.00)
Glucose, Bld: 91 mg/dL (ref 70–99)
Potassium: 3.8 meq/L (ref 3.5–5.1)
Sodium: 140 meq/L (ref 135–145)
Total Bilirubin: 0.5 mg/dL (ref 0.2–1.2)
Total Protein: 7.4 g/dL (ref 6.0–8.3)

## 2023-10-12 LAB — LIPID PANEL
Cholesterol: 150 mg/dL (ref 0–200)
HDL: 31.9 mg/dL — ABNORMAL LOW (ref >39.00)
LDL Cholesterol: 96 mg/dL (ref 0–99)
NonHDL: 118.29
Total CHOL/HDL Ratio: 5
Triglycerides: 112 mg/dL (ref 0.0–149.0)
VLDL: 22.4 mg/dL (ref 0.0–40.0)

## 2023-10-12 LAB — PSA: PSA: 3.98 ng/mL (ref 0.10–4.00)

## 2023-10-12 LAB — TSH: TSH: 1.32 u[IU]/mL (ref 0.35–5.50)

## 2023-12-05 ENCOUNTER — Ambulatory Visit (INDEPENDENT_AMBULATORY_CARE_PROVIDER_SITE_OTHER): Admitting: Adult Health

## 2023-12-05 ENCOUNTER — Ambulatory Visit: Payer: Self-pay

## 2023-12-05 ENCOUNTER — Encounter: Payer: Self-pay | Admitting: Adult Health

## 2023-12-05 VITALS — BP 130/80 | HR 70 | Temp 97.8°F | Ht 70.0 in | Wt 149.0 lb

## 2023-12-05 DIAGNOSIS — F39 Unspecified mood [affective] disorder: Secondary | ICD-10-CM

## 2023-12-05 DIAGNOSIS — R42 Dizziness and giddiness: Secondary | ICD-10-CM | POA: Diagnosis not present

## 2023-12-05 MED ORDER — CITALOPRAM HYDROBROMIDE 20 MG PO TABS: 20.0000 mg | ORAL_TABLET | Freq: Every day | ORAL | 1 refills | Status: DC

## 2023-12-05 MED ORDER — MECLIZINE HCL 25 MG PO TABS: 25.0000 mg | ORAL_TABLET | Freq: Three times a day (TID) | ORAL | 0 refills | Status: DC | PRN

## 2023-12-05 NOTE — Patient Instructions (Signed)
 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 I have sent in a medication called Meclizine  to help with this. Take one tabler three times a day as needed until the dizziness is gone. This medication can make you feel sleepy   I have also increase your Celexa  from 10 mg to 20 mg

## 2023-12-05 NOTE — Telephone Encounter (Signed)
 FYI Only or Action Required?: Action required by provider: update on patient condition.  Patient was last seen in primary care on 10/11/2023 by Merna Huxley, NP.  Called Nurse Triage reporting Dizziness.  Symptoms began several weeks ago.  Interventions attempted: Nothing.  Symptoms are: unchanged.  Triage Disposition: Information or Advice Only Call  Patient/caregiver understands and will follow disposition?: Yes       Copied from CRM [id number]. Topic: Clinical - Red Word Triage >> Dec 05, 2023 12:52 PM Jayma L wrote: Red Word that prompted transfer to Nurse Triage: caretaker calling , stated patient is getting moody and falling and is dizzy , fell last about 2 weeks ago . Said everyday he's getting dizzy and its getting worse. Wants to let someone know.. Reason for Disposition  [1] Caller is not with the adult (patient) AND [2] probable NON-URGENT symptoms  Answer Assessment - Initial Assessment Questions 1. REASON FOR CALL: What is the main reason for your call? or How can I best help you?     Dorn, caregiver calling to let PCP know what  pt needs to be seen about today. States pt has been having dizziness daily and has been having more recent falls. States last fall was 2-3 wks ago. States pt also having moods changes as to where pt is more irritable. States that pt sometimes gets mad and hits himself. States he is not concerned of the pt hurting himself or suicide.  2. SYMPTOMS : Do you have any symptoms?      Pt having dizziness 3. OTHER QUESTIONS: Do you have any other questions?     no  Protocols used: Information Only Call - No Triage-A-[facility]

## 2023-12-05 NOTE — Telephone Encounter (Signed)
 Dorn, caregiver calling to let PCP know what  pt needs to be seen about today at his appt. States pt has been having dizziness daily and has been having more recent falls. States last fall was 2-3 wks ago. States pt also having moods changes as to where pt is more irritable. States that pt sometimes gets mad and hits himself. States he is not concerned of the pt hurting himself or suicide.

## 2023-12-05 NOTE — Progress Notes (Signed)
 Subjective:    Patient ID: Frank Rogers, male    DOB: 1951/01/31, 73 y.o.   MRN: [id number]  HPI 73 year old male who  has a past medical history of Arthritis, Chicken pox, Migraines, and Seasonal allergies.  He presents to the office today today by himself for the concern of dizziness and frequent falls. He reports that for the last week he has been feeling dizzy, especially when he gets up too quickly. He has had some falls because of the dizziness but has  not injured himself. He has not passed out.   His family also sent a note today that the patient has been more irritable and will become mad and hit himself. The patient denies feeling irritable. He does not want to hurt himself.  He is taking Wellbutrin  300 mg extended release daily as well as Celexa  10 mg daily for anxiety and depression  Review of Systems See HPI   Past Medical History:  Diagnosis Date   Arthritis    Chicken pox    Migraines    Seasonal allergies     Social History   Socioeconomic History   Marital status: Single    Spouse name: Not on file   Number of children: 0   Years of education: 10th   Highest education level: 10th grade  Occupational History   Not on file  Tobacco Use   Smoking status: Some Days    Types: Pipe, Cigars    Passive exposure: Never   Smokeless tobacco: Current  Vaping Use   Vaping status: Never Used  Substance and Sexual Activity   Alcohol use: Not Currently    Alcohol/week: 2.0 standard drinks of alcohol    Types: 2 Cans of beer per week    Comment: occasionally   Drug use: No   Sexual activity: Not Currently  Other Topics Concern   Not on file  Social History Narrative   He is retired    Not married    Social Drivers of Corporate investment banker Strain: Low Risk  (07/06/2022)   Overall Financial Resource Strain (CARDIA)    Difficulty of Paying Living Expenses: Not hard at all  Food Insecurity: No Food Insecurity (07/06/2022)   Hunger Vital Sign     Worried About Running Out of Food in the Last Year: Never true    Ran Out of Food in the Last Year: Never true  Transportation Needs: No Transportation Needs (07/06/2022)   PRAPARE - Transportation    Lack of Transportation (Medical): No    Lack of Transportation (Non-Medical): No  Physical Activity: Inactive (07/06/2022)   Exercise Vital Sign    Days of Exercise per Week: 0 days    Minutes of Exercise per Session: 0 min  Stress: No Stress Concern Present (07/06/2022)   [employer] of Occupational Health - Occupational Stress Questionnaire    Feeling of Stress : Not at all  Social Connections: Moderately Integrated (07/06/2022)   Social Connection and Isolation Panel    Frequency of Communication with Friends and Family: More than three times a week    Frequency of Social Gatherings with Friends and Family: More than three times a week    Attends Religious Services: More than 4 times per year    Active Member of [employer] or Organizations: Yes    Attends Banker Meetings: More than 4 times per year    Marital Status: Never married  Intimate Partner Violence: Not At Risk (07/06/2022)  Humiliation, Afraid, Rape, and Kick questionnaire    Fear of Current or Ex-Partner: No    Emotionally Abused: No    Physically Abused: No    Sexually Abused: No    History reviewed. No pertinent surgical history.  Family History  Problem Relation Age of Onset   Arthritis Mother    Hearing loss Mother    Stroke Mother    Diabetes Father    Hearing loss Father    Heart attack Sister    Hyperlipidemia Sister    Hypertension Sister     No Known Allergies  Current Outpatient Medications on File Prior to Visit  Medication Sig Dispense Refill   allopurinol  (ZYLOPRIM ) 300 MG tablet Take 1 tablet (300 mg total) by mouth daily. 90 tablet 3   Aromatic Inhalants (VICKS VAPOINHALER IN) Inhale into the lungs.     atorvastatin  (LIPITOR) 10 MG tablet Take 1 tablet (10 mg total) by mouth daily.  90 tablet 3   buPROPion  (WELLBUTRIN  XL) 300 MG 24 hr tablet Take 1 tablet (300 mg total) by mouth daily. 90 tablet 3   finasteride  (PROSCAR ) 5 MG tablet TAKE AS DIRECTED 90 tablet 3   omeprazole  (PRILOSEC) 20 MG capsule TAKE 1 CAPSULE BY MOUTH EVERY DAY 90 capsule 1   omeprazole  (PRILOSEC) 40 MG capsule Take 1 capsule (40 mg total) by mouth daily. 90 capsule 3   OVER THE COUNTER MEDICATION OTC medication for infection in the foot (name unknown)     tamsulosin  (FLOMAX ) 0.4 MG CAPS capsule Take 1 capsule (0.4 mg total) by mouth daily. 90 capsule 3   No current facility-administered medications on file prior to visit.    BP 130/80   Pulse 70   Temp 97.8 F (36.6 C) (Oral)   Ht 5' 10 (1.778 m)   Wt 149 lb (67.6 kg)   SpO2 97%   BMI 21.38 kg/m       Objective:   Physical Exam Vitals and nursing note reviewed.  Constitutional:      Appearance: Normal appearance.  Eyes:     Extraocular Movements:     Right eye: Nystagmus (horizontal) present.     Left eye: Nystagmus (horizontal) present.  Cardiovascular:     Rate and Rhythm: Normal rate and regular rhythm.     Pulses: Normal pulses.     Heart sounds: Normal heart sounds.  Pulmonary:     Effort: Pulmonary effort is normal.     Breath sounds: Normal breath sounds.  Musculoskeletal:        General: Normal range of motion.  Skin:    General: Skin is warm and dry.  Neurological:     General: No focal deficit present.     Mental Status: He is alert and oriented to person, place, and time.     Cranial Nerves: Cranial nerves 2-12 are intact.     Sensory: Sensation is intact.     Motor: Motor function is intact.     Coordination: Coordination is intact.     Comments: Became dizzy on exam table with change in positions from supine to sitting.   Psychiatric:        Mood and Affect: Mood normal.        Behavior: Behavior normal.        Thought Content: Thought content normal.        Judgment: Judgment normal.         Assessment & Plan:  1. Mood disorder (HCC) (Primary) - Will increase  Celexa  to 20 mg daily  - citalopram  (CELEXA ) 20 MG tablet; Take 1 tablet (20 mg total) by mouth daily.  Dispense: 90 tablet; Refill: 1  2. Vertigo - Will start on Antivert  for a short duration. If no improvement then will send to PT  - meclizine  (ANTIVERT ) 25 MG tablet; Take 1 tablet (25 mg total) by mouth 3 (three) times daily as needed.  Dispense: 30 tablet; Refill: 0  Darleene Shape, NP

## 2024-04-02 ENCOUNTER — Ambulatory Visit: Admitting: Family Medicine

## 2024-05-27 ENCOUNTER — Other Ambulatory Visit: Payer: Self-pay | Admitting: Adult Health

## 2024-05-27 DIAGNOSIS — R42 Dizziness and giddiness: Secondary | ICD-10-CM

## 2024-06-05 ENCOUNTER — Other Ambulatory Visit: Payer: Self-pay | Admitting: Adult Health

## 2024-06-05 DIAGNOSIS — F39 Unspecified mood [affective] disorder: Secondary | ICD-10-CM
